# Patient Record
Sex: Male | Born: 1945 | Race: White | Hispanic: No | Smoking: Never smoker
Health system: Southern US, Community
[De-identification: ages and names within clinical notes are randomized; demographics above are authoritative.]

## PROBLEM LIST (undated history)

## (undated) DIAGNOSIS — E119 Type 2 diabetes mellitus without complications: Secondary | ICD-10-CM

## (undated) DIAGNOSIS — F329 Major depressive disorder, single episode, unspecified: Secondary | ICD-10-CM

## (undated) DIAGNOSIS — N289 Disorder of kidney and ureter, unspecified: Secondary | ICD-10-CM

## (undated) DIAGNOSIS — N132 Hydronephrosis with renal and ureteral calculous obstruction: Secondary | ICD-10-CM

## (undated) DIAGNOSIS — I10 Essential (primary) hypertension: Secondary | ICD-10-CM

## (undated) DIAGNOSIS — S22009A Unspecified fracture of unspecified thoracic vertebra, initial encounter for closed fracture: Secondary | ICD-10-CM

## (undated) DIAGNOSIS — T148XXA Other injury of unspecified body region, initial encounter: Secondary | ICD-10-CM

## (undated) DIAGNOSIS — S2239XA Fracture of one rib, unspecified side, initial encounter for closed fracture: Secondary | ICD-10-CM

## (undated) DIAGNOSIS — E785 Hyperlipidemia, unspecified: Secondary | ICD-10-CM

## (undated) DIAGNOSIS — F32A Depression, unspecified: Secondary | ICD-10-CM

## (undated) DIAGNOSIS — R55 Syncope and collapse: Secondary | ICD-10-CM

## (undated) DIAGNOSIS — E7800 Pure hypercholesterolemia, unspecified: Secondary | ICD-10-CM

## (undated) DIAGNOSIS — M1990 Unspecified osteoarthritis, unspecified site: Secondary | ICD-10-CM

## (undated) HISTORY — DX: Fracture of one rib, unspecified side, initial encounter for closed fracture: S22.39XA

## (undated) HISTORY — DX: Essential (primary) hypertension: I10

## (undated) HISTORY — DX: Other injury of unspecified body region, initial encounter: T14.8XXA

## (undated) HISTORY — DX: Hyperlipidemia, unspecified: E78.5

## (undated) HISTORY — DX: Depression, unspecified: F32.A

## (undated) HISTORY — DX: Hydronephrosis with renal and ureteral calculous obstruction: N13.2

## (undated) HISTORY — DX: Type 2 diabetes mellitus without complications: E11.9

## (undated) HISTORY — DX: Major depressive disorder, single episode, unspecified: F32.9

## (undated) HISTORY — PX: SKIN GRAFT: SHX250

## (undated) HISTORY — DX: Unspecified fracture of unspecified thoracic vertebra, initial encounter for closed fracture: S22.009A

## (undated) HISTORY — DX: Syncope and collapse: R55

## (undated) HISTORY — DX: Pure hypercholesterolemia, unspecified: E78.00

## (undated) HISTORY — PX: CLAVICLE SURGERY: SHX598

## (undated) HISTORY — DX: Unspecified osteoarthritis, unspecified site: M19.90

## (undated) HISTORY — PX: SHOULDER SURGERY: SHX246

---

## 1999-02-19 ENCOUNTER — Ambulatory Visit (HOSPITAL_COMMUNITY): Admission: RE | Admit: 1999-02-19 | Discharge: 1999-02-19 | Payer: Self-pay | Admitting: Gastroenterology

## 2000-01-03 ENCOUNTER — Emergency Department (HOSPITAL_COMMUNITY): Admission: EM | Admit: 2000-01-03 | Discharge: 2000-01-03 | Payer: Self-pay | Admitting: Emergency Medicine

## 2000-01-04 ENCOUNTER — Encounter: Payer: Self-pay | Admitting: Emergency Medicine

## 2002-02-15 ENCOUNTER — Other Ambulatory Visit: Admission: RE | Admit: 2002-02-15 | Discharge: 2002-02-15 | Payer: Self-pay | Admitting: *Deleted

## 2005-05-31 ENCOUNTER — Emergency Department (HOSPITAL_COMMUNITY): Admission: EM | Admit: 2005-05-31 | Discharge: 2005-05-31 | Payer: Self-pay | Admitting: Emergency Medicine

## 2011-11-09 ENCOUNTER — Other Ambulatory Visit: Payer: Self-pay | Admitting: Gastroenterology

## 2012-12-20 ENCOUNTER — Other Ambulatory Visit: Payer: Self-pay | Admitting: Family Medicine

## 2012-12-20 DIAGNOSIS — G8929 Other chronic pain: Secondary | ICD-10-CM

## 2012-12-27 ENCOUNTER — Ambulatory Visit
Admission: RE | Admit: 2012-12-27 | Discharge: 2012-12-27 | Disposition: A | Discharge status: Home / Self Care | Payer: Medicare HMO | Source: Ambulatory Visit | Attending: Family Medicine | Admitting: Family Medicine

## 2012-12-27 DIAGNOSIS — G8929 Other chronic pain: Secondary | ICD-10-CM

## 2012-12-27 MED ORDER — IOHEXOL 300 MG/ML  SOLN
100.0000 mL | Freq: Once | INTRAMUSCULAR | Status: AC | PRN
  Administered 2012-12-27: 100 mL via INTRAVENOUS

## 2013-03-26 ENCOUNTER — Other Ambulatory Visit: Payer: Self-pay | Admitting: Family Medicine

## 2013-03-26 ENCOUNTER — Ambulatory Visit
Admission: RE | Admit: 2013-03-26 | Discharge: 2013-03-26 | Disposition: A | Discharge status: Home / Self Care | Payer: Medicare HMO | Source: Ambulatory Visit | Attending: Family Medicine | Admitting: Family Medicine

## 2013-03-26 DIAGNOSIS — M542 Cervicalgia: Secondary | ICD-10-CM

## 2013-03-26 DIAGNOSIS — M545 Low back pain: Secondary | ICD-10-CM

## 2013-10-10 ENCOUNTER — Ambulatory Visit
Admission: RE | Admit: 2013-10-10 | Discharge: 2013-10-10 | Disposition: A | Discharge status: Home / Self Care | Payer: Medicare HMO | Source: Ambulatory Visit | Attending: Family Medicine | Admitting: Family Medicine

## 2013-10-10 ENCOUNTER — Other Ambulatory Visit: Payer: Self-pay | Admitting: Family Medicine

## 2013-10-10 DIAGNOSIS — M2550 Pain in unspecified joint: Secondary | ICD-10-CM

## 2013-10-11 ENCOUNTER — Ambulatory Visit (INDEPENDENT_AMBULATORY_CARE_PROVIDER_SITE_OTHER): Payer: Medicare HMO | Admitting: General Surgery

## 2013-10-19 ENCOUNTER — Ambulatory Visit (INDEPENDENT_AMBULATORY_CARE_PROVIDER_SITE_OTHER): Payer: Medicare HMO | Admitting: General Surgery

## 2013-10-19 ENCOUNTER — Encounter (INDEPENDENT_AMBULATORY_CARE_PROVIDER_SITE_OTHER): Payer: Self-pay | Admitting: General Surgery

## 2013-10-19 VITALS — Temp 98.0°F | Ht 68.0 in | Wt 176.8 lb

## 2013-10-19 DIAGNOSIS — K4090 Unilateral inguinal hernia, without obstruction or gangrene, not specified as recurrent: Secondary | ICD-10-CM
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
## 2013-10-19 NOTE — Patient Instructions (Signed)
You have a large left inguinal hernia. We are able to push this back in so there is no immediate danger  You'll be scheduled for elective repair of your left inguinal hernia with mesh in the near future     Inguinal Hernia, Adult  Care After Refer to this sheet in the next few weeks. These discharge instructions provide you with general information on caring for yourself after you leave the hospital. Your caregiver may also give you specific instructions. Your treatment has been planned according to the most current medical practices available, but unavoidable complications sometimes occur. If you have any problems or questions after discharge, please call your caregiver. HOME CARE INSTRUCTIONS  Put ice on the operative site.  Put ice in a plastic bag.  Place a towel between your skin and the bag.  Leave the ice on for 15-20 minutes at a time, 03-04 times a day while awake.  Change bandages (dressings) as directed.  Keep the wound dry and clean. The wound may be washed gently with soap and water. Gently blot or dab the wound dry. It is okay to take showers 24 to 48 hours after surgery. Do not take baths, use swimming pools, or use hot tubs for 10 days, or as directed by your caregiver.  Only take over-the-counter or prescription medicines for pain, discomfort, or fever as directed by your caregiver.  Continue your normal diet as directed.  Do not lift anything more than 10 pounds or play contact sports for 3 weeks, or as directed. SEEK MEDICAL CARE IF:  There is redness, swelling, or increasing pain in the wound.  There is fluid (pus) coming from the wound.  There is drainage from a wound lasting longer than 1 day.  You have an oral temperature above 102 F (38.9 C).  You notice a bad smell coming from the wound or dressing.  The wound breaks open after the stitches (sutures) have been removed.  You notice increasing pain in the shoulders (shoulder strap areas).  You  develop dizzy episodes or fainting while standing.  You feel sick to your stomach (nauseous) or throw up (vomit). SEEK IMMEDIATE MEDICAL CARE IF:  You develop a rash.  You have difficulty breathing.  You develop a reaction or have side effects to medicines you were given. MAKE SURE YOU:   Understand these instructions.  Will watch your condition.  Will get help right away if you are not doing well or get worse. Document Released: 09/02/2006 Document Revised: 10/25/2011 Document Reviewed: 07/02/2009 [facility] Patient Information 2014 [place], [place]. Inguinal Hernia, Adult Muscles help keep everything in the body in its proper place. But if a weak spot in the muscles develops, something can poke through. That is called a hernia. When this happens in the lower part of the belly (abdomen), it is called an inguinal hernia. (It takes its name from a part of the body in this region called the inguinal canal.) A weak spot in the wall of muscles lets some fat or part of the small intestine bulge through. An inguinal hernia can develop at any age. Men get them more often than women. CAUSES  In adults, an inguinal hernia develops over time.  It can be triggered by:  Suddenly straining the muscles of the lower abdomen.  Lifting heavy objects.  Straining to have a bowel movement. Difficult bowel movements (constipation) can lead to this.  Constant coughing. This may be caused by smoking or lung disease.  Being overweight.  Being  pregnant.  Working at a job that requires long periods of standing or heavy lifting.  Having had an inguinal hernia before. One type can be an emergency situation. It is called a strangulated inguinal hernia. It develops if part of the small intestine slips through the weak spot and cannot get back into the abdomen. The blood supply can be cut off. If that happens, part of the intestine may die. This situation requires emergency surgery. SYMPTOMS  Often, a small  inguinal hernia has no symptoms. It is found when a healthcare provider does a physical exam. Larger hernias usually have symptoms.   In adults, symptoms may include:  A lump in the groin. This is easier to see when the person is standing. It might disappear when lying down.  In men, a lump in the scrotum.  Pain or burning in the groin. This occurs especially when lifting, straining or coughing.  A dull ache or feeling of pressure in the groin.  Signs of a strangulated hernia can include:  A bulge in the groin that becomes very painful and tender to the touch.  A bulge that turns red or purple.  Fever, nausea and vomiting.  Inability to have a bowel movement or to pass gas. DIAGNOSIS  To decide if you have an inguinal hernia, a healthcare provider will probably do a physical examination.  This will include asking questions about any symptoms you have noticed.  The healthcare provider might feel the groin area and ask you to cough. If an inguinal hernia is felt, the healthcare provider may try to slide it back into the abdomen.  Usually no other tests are needed. TREATMENT  Treatments can vary. The size of the hernia makes a difference. Options include:  Watchful waiting. This is often suggested if the hernia is small and you have had no symptoms.  No medical procedure will be done unless symptoms develop.  You will need to watch closely for symptoms. If any occur, contact your healthcare provider right away.  Surgery. This is used if the hernia is larger or you have symptoms.  Open surgery. This is usually an outpatient procedure (you will not stay overnight in a hospital). An cut (incision) is made through the skin in the groin. The hernia is put back inside the abdomen. The weak area in the muscles is then repaired by herniorrhaphy or hernioplasty. Herniorrhaphy: in this type of surgery, the weak muscles are sewn back together. Hernioplasty: a patch or mesh is used to close  the weak area in the abdominal wall.  Laparoscopy. In this procedure, a surgeon makes small incisions. A thin tube with a tiny video camera (called a laparoscope) is put into the abdomen. The surgeon repairs the hernia with mesh by looking with the video camera and using two long instruments. HOME CARE INSTRUCTIONS   After surgery to repair an inguinal hernia:  You will need to take pain medicine prescribed by your healthcare provider. Follow all directions carefully.  You will need to take care of the wound from the incision.  Your activity will be restricted for awhile. This will probably include no heavy lifting for several weeks. You also should not do anything too active for a few weeks. When you can return to work will depend on the type of job that you have.  During "watchful waiting" periods, you should:  Maintain a healthy weight.  Eat a diet high in fiber (fruits, vegetables and whole grains).  Drink plenty of fluids to  avoid constipation. This means drinking enough water and other liquids to keep your urine clear or pale yellow.  Do not lift heavy objects.  Do not stand for long periods of time.  Quit smoking. This should keep you from developing a frequent cough. SEEK MEDICAL CARE IF:   A bulge develops in your groin area.  You feel pain, a burning sensation or pressure in the groin. This might be worse if you are lifting or straining.  You develop a fever of more than 100.5 F (38.1 C). SEEK IMMEDIATE MEDICAL CARE IF:   Pain in the groin increases suddenly.  A bulge in the groin gets bigger suddenly and does not go down.  For men, there is sudden pain in the scrotum. Or, the size of the scrotum increases.  A bulge in the groin area becomes red or purple and is painful to touch.  You have nausea or vomiting that does not go away.  You feel your heart beating much faster than normal.  You cannot have a bowel movement or pass gas.  You develop a fever of  more than 102.0 F (38.9 C). Document Released: 12/19/2008 Document Revised: 10/25/2011 Document Reviewed: 12/19/2008 [facility] Patient Information 2014 [place], [place].

## 2013-12-07 ENCOUNTER — Other Ambulatory Visit (INDEPENDENT_AMBULATORY_CARE_PROVIDER_SITE_OTHER): Payer: Self-pay

## 2013-12-07 DIAGNOSIS — K4090 Unilateral inguinal hernia, without obstruction or gangrene, not specified as recurrent: Secondary | ICD-10-CM

## 2013-12-07 HISTORY — PX: OTHER SURGICAL HISTORY: SHX169

## 2013-12-07 MED ORDER — OXYCODONE-ACETAMINOPHEN 7.5-325 MG PO TABS: 1.0000 | ORAL_TABLET | ORAL | Status: DC | PRN

## 2013-12-10 ENCOUNTER — Telehealth (INDEPENDENT_AMBULATORY_CARE_PROVIDER_SITE_OTHER): Payer: Self-pay | Admitting: General Surgery

## 2013-12-10 NOTE — Telephone Encounter (Signed)
Pt called and is having some skin irritation below hernia site  And where shaved and prep him for surgery/ no redness or infection seen by pt. Suggested that he try some zinc oxide or vasaline on area . If does not get better to let us know. He will call within next 24-48 hours if not better . He seemed satisfied  With trying that for now. 12/10/13

## 2013-12-25 ENCOUNTER — Ambulatory Visit (INDEPENDENT_AMBULATORY_CARE_PROVIDER_SITE_OTHER): Payer: Medicare HMO | Admitting: General Surgery

## 2013-12-25 ENCOUNTER — Encounter (INDEPENDENT_AMBULATORY_CARE_PROVIDER_SITE_OTHER): Payer: Self-pay | Admitting: General Surgery

## 2013-12-25 VITALS — BP 130/70 | HR 80 | Resp 18 | Ht 68.0 in | Wt 174.4 lb

## 2013-12-25 DIAGNOSIS — K4090 Unilateral inguinal hernia, without obstruction or gangrene, not specified as recurrent: Secondary | ICD-10-CM

## 2013-12-25 NOTE — Progress Notes (Signed)
Patient ID: Gerald Ray, male   DOB: [birth year], 68 y.o.   MRN: [id number] History: This patient underwent open repair of left inguinal hernia with mesh: 12/07/2013. He is doing very well. He is requesting that he be allowed to play golf this weekend. No complaints of that wound. It is a little burning and numbness beneath the incision. Penis and scrotum feels fine  Exam: Right groin incision healing normally. Tissues quite soft. No hematoma. Repair intact. Penis scrotum and testes normal  Assessment: Left inguinal hernia, recovering uneventfully following open repair with mesh  Plan: I told him that he could pitch and putt  this weekend but to wait another 10 days before taking a full swing. Lots of walking encouraged to stretch the groins. Return to see me as needed.   Edsel Petrin. Dalbert Batman, M.D., [facility] General and Minimally invasive Surgery Breast and Colorectal Surgery Office:   [phone] Pager:   [phone]

## 2013-12-25 NOTE — Patient Instructions (Signed)
You are recovering from your left inguinal hernia repair with mesh without any obvious surgical complications.  We have talked about stretching and exercise and return to normal activities.  Return to see Dr. Dalbert Batman if necessary.

## 2014-01-25 ENCOUNTER — Encounter (INDEPENDENT_AMBULATORY_CARE_PROVIDER_SITE_OTHER): Payer: Self-pay | Admitting: Surgery

## 2014-01-25 ENCOUNTER — Ambulatory Visit (INDEPENDENT_AMBULATORY_CARE_PROVIDER_SITE_OTHER): Payer: Medicare HMO | Admitting: Surgery

## 2014-01-25 VITALS — BP 122/76 | HR 69 | Temp 97.8°F | Resp 16 | Ht 68.0 in | Wt 177.0 lb

## 2014-01-25 DIAGNOSIS — L03314 Cellulitis of groin: Secondary | ICD-10-CM

## 2014-01-25 DIAGNOSIS — K4090 Unilateral inguinal hernia, without obstruction or gangrene, not specified as recurrent: Secondary | ICD-10-CM

## 2014-01-25 DIAGNOSIS — L03319 Cellulitis of trunk, unspecified: Secondary | ICD-10-CM

## 2014-01-25 DIAGNOSIS — L02219 Cutaneous abscess of trunk, unspecified: Secondary | ICD-10-CM

## 2014-01-25 MED ORDER — MUPIROCIN 2 % EX OINT
TOPICAL_OINTMENT | CUTANEOUS | Status: DC
Start: 2014-01-25 — End: 2017-12-01

## 2014-01-25 MED ORDER — SULFAMETHOXAZOLE-TMP DS 800-160 MG PO TABS: 1.0000 | ORAL_TABLET | Freq: Two times a day (BID) | ORAL | Status: DC

## 2014-01-25 NOTE — Progress Notes (Signed)
General Surgery [facility]  Chief Complaint  Patient presents with  . Post-op Problem    drainage from hernia incision - patient of Dr. Dalbert Batman    HISTORY: Patient is a 68 year old male who underwent repair of left inguinal hernia with mesh in late April 2015. Patient has developed swelling, redness, and drainage from just above the midportion of his left inguinal incision. He presents today for evaluation.  EXAM: There is erythema involving the entire upper skin flap above the incision in the left groin. There is an ulcerated wound in the central portion measuring 1 cm in greatest diameter. There is a tiny amount of what appears to be serous drainage. There is mild to moderate induration and mild tenderness. Palpation in the inguinal canal shows no sign of recurrent hernia and no abscess.  IMPRESSION: Cellulitis involving left groin incision following hernia repair  PLAN: I am going to start the patient on Bactrim DS for 10 days. I will also give him topical Bactroban ointment to apply to the wound.  Patient will cleanse the wound daily with soap and water.  Patient will return for wound check to see Dr. Fanny Skates in 10-14 days.  Earnstine Regal, MD, [facility]   Visit Diagnoses: 1. Left inguinal hernia   2. Cellulitis of groin, left

## 2014-01-25 NOTE — Patient Instructions (Signed)
Take Bactrim as directed.  Applied Bactroban ointment as directed.  Shower with soap and water once daily.  Cover wound with dry gauze or Band-Aids as long as there is drainage.  Earnstine Regal, MD, [facility] Office: [phone]

## 2014-02-01 ENCOUNTER — Ambulatory Visit (INDEPENDENT_AMBULATORY_CARE_PROVIDER_SITE_OTHER): Payer: Medicare HMO | Admitting: General Surgery

## 2014-02-01 ENCOUNTER — Encounter (INDEPENDENT_AMBULATORY_CARE_PROVIDER_SITE_OTHER): Payer: Self-pay | Admitting: General Surgery

## 2014-02-01 VITALS — BP 132/78 | HR 80 | Temp 97.5°F | Ht 68.0 in | Wt 171.0 lb

## 2014-02-01 DIAGNOSIS — L02219 Cutaneous abscess of trunk, unspecified: Secondary | ICD-10-CM

## 2014-02-01 DIAGNOSIS — L03314 Cellulitis of groin: Secondary | ICD-10-CM

## 2014-02-01 DIAGNOSIS — K4090 Unilateral inguinal hernia, without obstruction or gangrene, not specified as recurrent: Secondary | ICD-10-CM

## 2014-02-01 DIAGNOSIS — L03319 Cellulitis of trunk, unspecified: Secondary | ICD-10-CM

## 2014-02-01 NOTE — Progress Notes (Signed)
Patient ID: Gerald Ray, male   DOB: March 31, 1946, 68 y.o.   MRN: [id number] History: This patient underwent elective repair of left inguinal hernia with mesh on 12/07/2013. He is diabetic. Seen in the office on May 12 and everything looked good. Some occurred on June 12 at which time if he appeared to have a superficial wound infection and was placed on Bactrim DS. He states he feels much better. It is almost completely healed. He says he stopped wearing a bandage yesterday  Exam:  The patient looks well. Spears. No distress Left inguinal incision looks good. The tissues were quite soft and there is no evidence of any deep infection. There is still a tiny bit of faint erythema at the edge of the incision but no drainage or odor or abscess. The skin necrosis  Assessment:  superficial wound infection, resolving on antibiotics No evidence of deep infection or mesh infection Diabetes-type 2  Plan: Wound care and wound hygiene discussed Continue antibiotics for 3 more days Return to see me if there is any evidence of infection.   Edsel Petrin. Dalbert Batman, M.D., [facility] General and Minimally invasive Surgery Breast and Colorectal Surgery Office:   [phone] Pager:   [phone]

## 2014-02-01 NOTE — Patient Instructions (Addendum)
The infection in your left inguinal incision appears to be healing nicely. This appears to be very superficial, and does not appear to be involving the deep layers or the mesh.  Take a shower twice a day and then cover the wound with a clean dry Band-Aid.  Continue the antibiotics for 3 more days, as instructed  Return to see Dr. Dalbert Batman if there is any recurrence of redness pain or drainage.

## 2015-10-06 DIAGNOSIS — Z79899 Other long term (current) drug therapy: Secondary | ICD-10-CM | POA: Diagnosis not present

## 2015-10-06 DIAGNOSIS — E7800 Pure hypercholesterolemia, unspecified: Secondary | ICD-10-CM | POA: Diagnosis not present

## 2015-10-06 DIAGNOSIS — E119 Type 2 diabetes mellitus without complications: Secondary | ICD-10-CM | POA: Diagnosis not present

## 2015-10-06 DIAGNOSIS — F411 Generalized anxiety disorder: Secondary | ICD-10-CM | POA: Diagnosis not present

## 2015-10-06 DIAGNOSIS — Z0000 Encounter for general adult medical examination without abnormal findings: Secondary | ICD-10-CM | POA: Diagnosis not present

## 2015-10-06 DIAGNOSIS — Z1159 Encounter for screening for other viral diseases: Secondary | ICD-10-CM | POA: Diagnosis not present

## 2015-10-06 DIAGNOSIS — I10 Essential (primary) hypertension: Secondary | ICD-10-CM | POA: Diagnosis not present

## 2015-10-06 DIAGNOSIS — F419 Anxiety disorder, unspecified: Secondary | ICD-10-CM | POA: Diagnosis not present

## 2015-10-06 DIAGNOSIS — Z7984 Long term (current) use of oral hypoglycemic drugs: Secondary | ICD-10-CM | POA: Diagnosis not present

## 2016-01-09 DIAGNOSIS — E119 Type 2 diabetes mellitus without complications: Secondary | ICD-10-CM | POA: Diagnosis not present

## 2016-01-09 DIAGNOSIS — H25013 Cortical age-related cataract, bilateral: Secondary | ICD-10-CM | POA: Diagnosis not present

## 2016-01-09 DIAGNOSIS — H2513 Age-related nuclear cataract, bilateral: Secondary | ICD-10-CM | POA: Diagnosis not present

## 2016-01-09 DIAGNOSIS — Z0100 Encounter for examination of eyes and vision without abnormal findings: Secondary | ICD-10-CM | POA: Diagnosis not present

## 2016-04-20 DIAGNOSIS — Z23 Encounter for immunization: Secondary | ICD-10-CM | POA: Diagnosis not present

## 2016-04-20 DIAGNOSIS — I10 Essential (primary) hypertension: Secondary | ICD-10-CM | POA: Diagnosis not present

## 2016-04-20 DIAGNOSIS — E7800 Pure hypercholesterolemia, unspecified: Secondary | ICD-10-CM | POA: Diagnosis not present

## 2016-04-20 DIAGNOSIS — F419 Anxiety disorder, unspecified: Secondary | ICD-10-CM | POA: Diagnosis not present

## 2016-04-20 DIAGNOSIS — E119 Type 2 diabetes mellitus without complications: Secondary | ICD-10-CM | POA: Diagnosis not present

## 2016-04-20 DIAGNOSIS — Z7984 Long term (current) use of oral hypoglycemic drugs: Secondary | ICD-10-CM | POA: Diagnosis not present

## 2016-04-20 DIAGNOSIS — K219 Gastro-esophageal reflux disease without esophagitis: Secondary | ICD-10-CM | POA: Diagnosis not present

## 2016-04-20 DIAGNOSIS — Z79899 Other long term (current) drug therapy: Secondary | ICD-10-CM | POA: Diagnosis not present

## 2016-11-14 DIAGNOSIS — N132 Hydronephrosis with renal and ureteral calculous obstruction: Secondary | ICD-10-CM

## 2016-11-14 HISTORY — DX: Hydronephrosis with renal and ureteral calculous obstruction: N13.2

## 2016-11-18 ENCOUNTER — Encounter (HOSPITAL_COMMUNITY): Payer: Self-pay | Admitting: Emergency Medicine

## 2016-11-18 ENCOUNTER — Emergency Department (HOSPITAL_COMMUNITY): Payer: PPO

## 2016-11-18 ENCOUNTER — Emergency Department (HOSPITAL_COMMUNITY)
Admission: EM | Admit: 2016-11-18 | Discharge: 2016-11-18 | Disposition: A | Discharge status: Home / Self Care | Payer: PPO | Attending: Emergency Medicine | Admitting: Emergency Medicine

## 2016-11-18 DIAGNOSIS — R109 Unspecified abdominal pain: Secondary | ICD-10-CM | POA: Diagnosis not present

## 2016-11-18 DIAGNOSIS — I10 Essential (primary) hypertension: Secondary | ICD-10-CM | POA: Insufficient documentation

## 2016-11-18 DIAGNOSIS — N201 Calculus of ureter: Secondary | ICD-10-CM | POA: Diagnosis not present

## 2016-11-18 DIAGNOSIS — R1030 Lower abdominal pain, unspecified: Secondary | ICD-10-CM | POA: Diagnosis not present

## 2016-11-18 DIAGNOSIS — Z7984 Long term (current) use of oral hypoglycemic drugs: Secondary | ICD-10-CM | POA: Diagnosis not present

## 2016-11-18 DIAGNOSIS — N1330 Unspecified hydronephrosis: Secondary | ICD-10-CM | POA: Diagnosis not present

## 2016-11-18 DIAGNOSIS — Z7982 Long term (current) use of aspirin: Secondary | ICD-10-CM | POA: Insufficient documentation

## 2016-11-18 DIAGNOSIS — R319 Hematuria, unspecified: Secondary | ICD-10-CM | POA: Diagnosis not present

## 2016-11-18 DIAGNOSIS — E119 Type 2 diabetes mellitus without complications: Secondary | ICD-10-CM | POA: Diagnosis not present

## 2016-11-18 LAB — COMPREHENSIVE METABOLIC PANEL
ALT: 16 U/L — ABNORMAL LOW (ref 17–63)
ANION GAP: 18 — AB (ref 5–15)
AST: 28 U/L (ref 15–41)
Albumin: 4.1 g/dL (ref 3.5–5.0)
Alkaline Phosphatase: 46 U/L (ref 38–126)
BILIRUBIN TOTAL: 0.7 mg/dL (ref 0.3–1.2)
BUN: 26 mg/dL — AB (ref 6–20)
CHLORIDE: 99 mmol/L — AB (ref 101–111)
CO2: 22 mmol/L (ref 22–32)
Calcium: 9.8 mg/dL (ref 8.9–10.3)
Creatinine, Ser: 1.19 mg/dL (ref 0.61–1.24)
GFR calc Af Amer: >60 mL/min (ref >60)
GFR calc non Af Amer: 59 mL/min — ABNORMAL LOW (ref >60)
Glucose, Bld: 193 mg/dL — ABNORMAL HIGH (ref 65–99)
POTASSIUM: 3.9 mmol/L (ref 3.5–5.1)
Sodium: 139 mmol/L (ref 135–145)
TOTAL PROTEIN: 7.6 g/dL (ref 6.5–8.1)

## 2016-11-18 LAB — URINALYSIS, ROUTINE W REFLEX MICROSCOPIC
BACTERIA UA: NONE SEEN
BILIRUBIN URINE: NEGATIVE
Glucose, UA: 50 mg/dL — AB
Ketones, ur: 5 mg/dL — AB
Leukocytes, UA: NEGATIVE
NITRITE: NEGATIVE
PROTEIN: NEGATIVE mg/dL
SQUAMOUS EPITHELIAL / LPF: NONE SEEN
Specific Gravity, Urine: 1.024 (ref 1.005–1.030)
pH: 5 (ref 5.0–8.0)

## 2016-11-18 LAB — CBC
HEMATOCRIT: 38.2 % — AB (ref 39.0–52.0)
HEMOGLOBIN: 13.3 g/dL (ref 13.0–17.0)
MCH: 30.3 pg (ref 26.0–34.0)
MCHC: 34.8 g/dL (ref 30.0–36.0)
MCV: 87 fL (ref 78.0–100.0)
Platelets: 197 10*3/uL (ref 150–400)
RBC: 4.39 MIL/uL (ref 4.22–5.81)
RDW: 12.8 % (ref 11.5–15.5)
WBC: 9.3 10*3/uL (ref 4.0–10.5)

## 2016-11-18 LAB — LIPASE, BLOOD: Lipase: 18 U/L (ref 11–51)

## 2016-11-18 MED ORDER — IBUPROFEN 800 MG PO TABS: 800.0000 mg | ORAL_TABLET | Freq: Three times a day (TID) | ORAL | 0 refills | Status: AC

## 2016-11-18 MED ORDER — ONDANSETRON 4 MG PO TBDP: 4.0000 mg | ORAL_TABLET | Freq: Three times a day (TID) | ORAL | 0 refills | Status: DC | PRN

## 2016-11-18 MED ORDER — ONDANSETRON 4 MG PO TBDP
ORAL_TABLET | ORAL | Status: AC
  Filled 2016-11-18: qty 1

## 2016-11-18 MED ORDER — ONDANSETRON 4 MG PO TBDP
4.0000 mg | ORAL_TABLET | Freq: Once | ORAL | Status: AC | PRN
  Administered 2016-11-18: 4 mg via ORAL

## 2016-11-18 MED ORDER — TAMSULOSIN HCL 0.4 MG PO CAPS: 0.4000 mg | ORAL_CAPSULE | Freq: Every day | ORAL | 0 refills | Status: AC

## 2016-11-18 NOTE — ED Notes (Signed)
Patient transported to CT 

## 2016-11-18 NOTE — Discharge Instructions (Signed)
Take the prescribed medication as directed. Follow-up with urology if you have any ongoing issues. Return to the ED for new or worsening symptoms.

## 2016-11-18 NOTE — ED Notes (Signed)
Pt. Given urine strainer at this time.

## 2016-11-18 NOTE — ED Triage Notes (Signed)
Pt woke up at 4am with sudden onset of lower right abd pain that moved into his right flank. history of kidney stones. Pt has been "dry heaving" since pain started.

## 2016-11-18 NOTE — ED Provider Notes (Signed)
[facility] DEPT Provider Note   CSN: [id number] Arrival date & time: 11/18/16  0703     History   Chief Complaint Chief Complaint  Patient presents with  . Abdominal Pain    HPI Gerald Ray is a 71 y.o. male.  The history is provided by the patient and medical records.    71 year old male with history of arthritis, diabetes, hyperlipidemia, hypertension, presenting to the ED for right flank pain. States this began this morning around 4:30 AM. Was localized to his suprapubic region but has since had radiation to the right flank. He reports some associated nausea and dry heaves. He has not had any fever or chills. No difficulty urinating or hematuria. Does report history of kidney stones twice in the past, over 20 years ago. States these were able to be passed spontaneously. He has had a left inguinal hernia repair in the past, no other surgeries. He was given Zofran in the waiting room with resolution of his nausea. States he is comfortable at present.  Past Medical History:  Diagnosis Date  . Arthritis   . Diabetes mellitus without complication ([place])   . Hyperlipidemia   . Hypertension     Patient Active Problem List   Diagnosis Date Noted  . Cellulitis of groin, left 01/25/2014  . Left inguinal hernia 10/19/2013    Past Surgical History:  Procedure Laterality Date  . CLAVICLE SURGERY     left  . LIH w/mesh  12/07/13  . SHOULDER SURGERY     growth on right  . SKIN GRAFT     right leg       Home Medications    Prior to Admission medications   Medication Sig Start Date End Date Taking? Authorizing Provider  aspirin 81 MG tablet Take 81 mg by mouth daily.    Historical Provider, MD  CINNAMON PO Take 1,000 mg by mouth.    Historical Provider, MD  co-enzyme Q-10 50 MG capsule Take 50 mg by mouth daily.    Historical Provider, MD  glimepiride (AMARYL) 1 MG tablet Take 1 mg by mouth daily with breakfast.    Historical Provider, MD  lisinopril  (PRINIVIL,ZESTRIL) 20 MG tablet Take 20 mg by mouth daily.    Historical Provider, MD  metFORMIN (GLUCOPHAGE) 1000 MG tablet Take 1,000 mg by mouth 2 (two) times daily with a meal.    Historical Provider, MD  Multiple Vitamins-Minerals (SENTRY SENIOR PO) Take by mouth.    Historical Provider, MD  mupirocin ointment (BACTROBAN) 2 % Apply topically to area around incision three times daily. 01/25/14   Armandina Gemma, MD  sulfamethoxazole-trimethoprim (BACTRIM DS) 800-160 MG per tablet Take 1 tablet by mouth 2 (two) times daily. 01/25/14   Armandina Gemma, MD    Family History Family History  Problem Relation Age of Onset  . Diabetes Father     Social History Social History  Substance Use Topics  . Smoking status: Never Smoker  . Smokeless tobacco: Never Used  . Alcohol use No     Allergies   Erythromycin and Vytorin [ezetimibe-simvastatin]   Review of Systems Review of Systems  Gastrointestinal: Positive for nausea.  Genitourinary: Positive for flank pain.  All other systems reviewed and are negative.    Physical Exam Updated Vital Signs BP (!) 149/71 (BP Location: Left Arm)   Pulse 75   Temp 97.8 F (36.6 C) (Oral)   Resp 17   Ht 5\' 10"  (1.778 m)   Wt 80.7 kg  SpO2 100%   BMI 25.54 kg/m   Physical Exam  Constitutional: He is oriented to person, place, and time. He appears well-developed and well-nourished.  HENT:  Head: Normocephalic and atraumatic.  Mouth/Throat: Oropharynx is clear and moist.  Eyes: Conjunctivae and EOM are normal. Pupils are equal, round, and reactive to light.  Neck: Normal range of motion.  Cardiovascular: Normal rate, regular rhythm and normal heart sounds.   Pulmonary/Chest: Effort normal and breath sounds normal. No respiratory distress. He has no wheezes.  Abdominal: Soft. Bowel sounds are normal. There is no tenderness. There is CVA tenderness (right). There is no rebound.  Musculoskeletal: Normal range of motion.  Neurological: He is alert  and oriented to person, place, and time.  Skin: Skin is warm and dry.  Psychiatric: He has a normal mood and affect.  Nursing note and vitals reviewed.    ED Treatments / Results  Labs (all labs ordered are listed, but only abnormal results are displayed) Labs Reviewed  COMPREHENSIVE METABOLIC PANEL - Abnormal; Notable for the following:       Result Value   Chloride 99 (*)    Glucose, Bld 193 (*)    BUN 26 (*)    ALT 16 (*)    GFR calc non Af Amer 59 (*)    Anion gap 18 (*)    All other components within normal limits  CBC - Abnormal; Notable for the following:    HCT 38.2 (*)    All other components within normal limits  URINALYSIS, ROUTINE W REFLEX MICROSCOPIC - Abnormal; Notable for the following:    APPearance HAZY (*)    Glucose, UA 50 (*)    Hgb urine dipstick LARGE (*)    Ketones, ur 5 (*)    All other components within normal limits  LIPASE, BLOOD    EKG  EKG Interpretation None       Radiology Ct Renal Stone Study  Result Date: 11/18/2016 CLINICAL DATA:  Right flank pain with hematuria EXAM: CT ABDOMEN AND PELVIS WITHOUT CONTRAST TECHNIQUE: Multidetector CT imaging of the abdomen and pelvis was performed following the standard protocol without IV contrast. COMPARISON:  12/27/2012 FINDINGS: Lower chest: No acute abnormality. Hepatobiliary: No focal liver abnormality is seen. No gallstones, gallbladder wall thickening, or biliary dilatation. Pancreas: Unremarkable. No pancreatic ductal dilatation or surrounding inflammatory changes. Spleen: Normal in size without focal abnormality. Adrenals/Urinary Tract: Normal adrenal glands. Punctate bilateral nonobstructing renal calculi. 2 mm distal right ureteral calculus resulting in mild right hydroureteronephrosis. Bilateral perinephric stranding. No renal mass. Decompressed bladder. Stomach/Bowel: No bowel dilatation or bowel wall thickening. Diverticulosis without evidence diverticulitis. No pneumatosis, pneumoperitoneum or  portal venous gas. No abdominal or pelvic free fluid. Vascular/Lymphatic: Normal caliber abdominal aorta with mild atherosclerosis. No lymphadenopathy. Reproductive: Prostate is unremarkable. Other: No abdominal wall hernia or abnormality. No abdominopelvic ascites. Musculoskeletal: No acute osseous abnormality. No lytic or sclerotic osseous lesion. Bilateral osteoarthritis of the sacroiliac joints. IMPRESSION: 1. 2 mm distal right ureteral calculus resulting in mild right hydroureteronephrosis. 2.  Aortic Atherosclerosis (ICD10-170.0) Electronically Signed   By: Kathreen Devoid   On: 11/18/2016 09:40    Procedures Procedures (including critical care time)  Medications Ordered in ED Medications  ondansetron (ZOFRAN-ODT) 4 MG disintegrating tablet (not administered)  ondansetron (ZOFRAN-ODT) disintegrating tablet 4 mg (4 mg Oral Given 11/18/16 0729)     Initial Impression / Assessment and Plan / ED Course  I have reviewed the triage vital signs and the nursing notes.  Pertinent labs & imaging results that were available during my care of the patient were reviewed by me and considered in my medical decision making (see chart for details).  71 year old male here with right flank pain. Began suddenly this morning around 4:30 AM.  He is afebrile and nontoxic. Report some nausea but was given Zofran in triage with improvement. He does have some mild right CVA tenderness. Labwork is overall reassuring. UA with large blood but no signs of infection. CT renal study obtained, there is evidence of 2 mm distal right ureteral calculus with mild hydroureteronephrosis. Patient remains comfortable here, has not required any pain medication. Other stable for discharge. He requested nonnarcotic pain medication, therefore Motrin, Zofran, Flomax given. He was given urology follow-up for any ongoing issues.  Also given urine strainer to monitor for passage of stone.  Discussed plan with patient, he acknowledged  understanding and agreed with plan of care.  Return precautions given for new or worsening symptoms.  Final Clinical Impressions(s) / ED Diagnoses   Final diagnoses:  Ureteral stone  Hematuria, unspecified type    New Prescriptions New Prescriptions   IBUPROFEN (ADVIL,MOTRIN) 800 MG TABLET    Take 1 tablet (800 mg total) by mouth 3 (three) times daily.   ONDANSETRON (ZOFRAN ODT) 4 MG DISINTEGRATING TABLET    Take 1 tablet (4 mg total) by mouth every 8 (eight) hours as needed for nausea.   TAMSULOSIN (FLOMAX) 0.4 MG CAPS CAPSULE    Take 1 capsule (0.4 mg total) by mouth daily after supper.     Larene Pickett, PA-C 11/18/16 [place], MD 11/19/16 1510

## 2017-03-09 DIAGNOSIS — I10 Essential (primary) hypertension: Secondary | ICD-10-CM | POA: Diagnosis not present

## 2017-03-09 DIAGNOSIS — H6121 Impacted cerumen, right ear: Secondary | ICD-10-CM | POA: Diagnosis not present

## 2017-03-09 DIAGNOSIS — E7800 Pure hypercholesterolemia, unspecified: Secondary | ICD-10-CM | POA: Diagnosis not present

## 2017-03-09 DIAGNOSIS — E119 Type 2 diabetes mellitus without complications: Secondary | ICD-10-CM | POA: Diagnosis not present

## 2017-03-09 DIAGNOSIS — N200 Calculus of kidney: Secondary | ICD-10-CM | POA: Diagnosis not present

## 2017-03-25 DIAGNOSIS — H6123 Impacted cerumen, bilateral: Secondary | ICD-10-CM | POA: Diagnosis not present

## 2017-03-25 DIAGNOSIS — H900 Conductive hearing loss, bilateral: Secondary | ICD-10-CM | POA: Diagnosis not present

## 2017-08-10 ENCOUNTER — Inpatient Hospital Stay (HOSPITAL_COMMUNITY)
Admission: EM | Admit: 2017-08-10 | Discharge: 2017-08-12 | DRG: 200 | Disposition: A | Discharge status: Home / Self Care | Payer: PPO | Attending: Surgery | Admitting: Surgery

## 2017-08-10 ENCOUNTER — Other Ambulatory Visit: Payer: Self-pay

## 2017-08-10 ENCOUNTER — Encounter (HOSPITAL_COMMUNITY): Payer: Self-pay | Admitting: *Deleted

## 2017-08-10 ENCOUNTER — Emergency Department (HOSPITAL_COMMUNITY): Payer: PPO

## 2017-08-10 DIAGNOSIS — J939 Pneumothorax, unspecified: Secondary | ICD-10-CM

## 2017-08-10 DIAGNOSIS — I10 Essential (primary) hypertension: Secondary | ICD-10-CM | POA: Diagnosis not present

## 2017-08-10 DIAGNOSIS — E119 Type 2 diabetes mellitus without complications: Secondary | ICD-10-CM | POA: Diagnosis present

## 2017-08-10 DIAGNOSIS — Z7984 Long term (current) use of oral hypoglycemic drugs: Secondary | ICD-10-CM

## 2017-08-10 DIAGNOSIS — W1789XA Other fall from one level to another, initial encounter: Secondary | ICD-10-CM | POA: Diagnosis present

## 2017-08-10 DIAGNOSIS — T148XXA Other injury of unspecified body region, initial encounter: Secondary | ICD-10-CM | POA: Diagnosis not present

## 2017-08-10 DIAGNOSIS — Y998 Other external cause status: Secondary | ICD-10-CM | POA: Diagnosis not present

## 2017-08-10 DIAGNOSIS — S272XXA Traumatic hemopneumothorax, initial encounter: Principal | ICD-10-CM | POA: Diagnosis present

## 2017-08-10 DIAGNOSIS — S22060A Wedge compression fracture of T7-T8 vertebra, initial encounter for closed fracture: Secondary | ICD-10-CM | POA: Diagnosis not present

## 2017-08-10 DIAGNOSIS — S0990XA Unspecified injury of head, initial encounter: Secondary | ICD-10-CM | POA: Diagnosis not present

## 2017-08-10 DIAGNOSIS — Y92821 Forest as the place of occurrence of the external cause: Secondary | ICD-10-CM

## 2017-08-10 DIAGNOSIS — S22069A Unspecified fracture of T7-T8 vertebra, initial encounter for closed fracture: Secondary | ICD-10-CM | POA: Diagnosis not present

## 2017-08-10 DIAGNOSIS — S270XXA Traumatic pneumothorax, initial encounter: Secondary | ICD-10-CM | POA: Diagnosis not present

## 2017-08-10 DIAGNOSIS — R55 Syncope and collapse: Secondary | ICD-10-CM | POA: Diagnosis present

## 2017-08-10 DIAGNOSIS — S22079A Unspecified fracture of T9-T10 vertebra, initial encounter for closed fracture: Secondary | ICD-10-CM | POA: Diagnosis present

## 2017-08-10 DIAGNOSIS — R402362 Coma scale, best motor response, obeys commands, at arrival to emergency department: Secondary | ICD-10-CM | POA: Diagnosis present

## 2017-08-10 DIAGNOSIS — S22070A Wedge compression fracture of T9-T10 vertebra, initial encounter for closed fracture: Secondary | ICD-10-CM | POA: Diagnosis not present

## 2017-08-10 DIAGNOSIS — R402142 Coma scale, eyes open, spontaneous, at arrival to emergency department: Secondary | ICD-10-CM | POA: Diagnosis not present

## 2017-08-10 DIAGNOSIS — R402252 Coma scale, best verbal response, oriented, at arrival to emergency department: Secondary | ICD-10-CM | POA: Diagnosis present

## 2017-08-10 DIAGNOSIS — R109 Unspecified abdominal pain: Secondary | ICD-10-CM | POA: Diagnosis not present

## 2017-08-10 DIAGNOSIS — S2239XA Fracture of one rib, unspecified side, initial encounter for closed fracture: Secondary | ICD-10-CM | POA: Diagnosis present

## 2017-08-10 DIAGNOSIS — S2241XA Multiple fractures of ribs, right side, initial encounter for closed fracture: Secondary | ICD-10-CM | POA: Diagnosis present

## 2017-08-10 DIAGNOSIS — S199XXA Unspecified injury of neck, initial encounter: Secondary | ICD-10-CM | POA: Diagnosis not present

## 2017-08-10 DIAGNOSIS — S299XXA Unspecified injury of thorax, initial encounter: Secondary | ICD-10-CM | POA: Diagnosis not present

## 2017-08-10 DIAGNOSIS — S2249XA Multiple fractures of ribs, unspecified side, initial encounter for closed fracture: Secondary | ICD-10-CM | POA: Diagnosis present

## 2017-08-10 DIAGNOSIS — Y9389 Activity, other specified: Secondary | ICD-10-CM | POA: Diagnosis not present

## 2017-08-10 DIAGNOSIS — S2231XA Fracture of one rib, right side, initial encounter for closed fracture: Secondary | ICD-10-CM | POA: Diagnosis not present

## 2017-08-10 DIAGNOSIS — J9811 Atelectasis: Secondary | ICD-10-CM | POA: Diagnosis not present

## 2017-08-10 DIAGNOSIS — M546 Pain in thoracic spine: Secondary | ICD-10-CM | POA: Diagnosis not present

## 2017-08-10 HISTORY — DX: Disorder of kidney and ureter, unspecified: N28.9

## 2017-08-10 LAB — URINALYSIS, ROUTINE W REFLEX MICROSCOPIC
BILIRUBIN URINE: NEGATIVE
GLUCOSE, UA: 50 mg/dL — AB
Ketones, ur: 5 mg/dL — AB
LEUKOCYTES UA: NEGATIVE
NITRITE: NEGATIVE
Protein, ur: 30 mg/dL — AB
SPECIFIC GRAVITY, URINE: 1.035 — AB (ref 1.005–1.030)
pH: 5 (ref 5.0–8.0)

## 2017-08-10 LAB — COMPREHENSIVE METABOLIC PANEL
ALBUMIN: 3.9 g/dL (ref 3.5–5.0)
ALK PHOS: 44 U/L (ref 38–126)
ALT: 35 U/L (ref 17–63)
ANION GAP: 9 (ref 5–15)
AST: 57 U/L — AB (ref 15–41)
BILIRUBIN TOTAL: 0.8 mg/dL (ref 0.3–1.2)
BUN: 25 mg/dL — ABNORMAL HIGH (ref 6–20)
CHLORIDE: 105 mmol/L (ref 101–111)
CO2: 23 mmol/L (ref 22–32)
Calcium: 9.1 mg/dL (ref 8.9–10.3)
Creatinine, Ser: 1.6 mg/dL — ABNORMAL HIGH (ref 0.61–1.24)
GFR calc non Af Amer: 41 mL/min — ABNORMAL LOW (ref >60)
GFR, EST AFRICAN AMERICAN: 48 mL/min — AB (ref >60)
Glucose, Bld: 231 mg/dL — ABNORMAL HIGH (ref 65–99)
POTASSIUM: 4.8 mmol/L (ref 3.5–5.1)
SODIUM: 137 mmol/L (ref 135–145)
TOTAL PROTEIN: 7.2 g/dL (ref 6.5–8.1)

## 2017-08-10 LAB — CBC
HEMATOCRIT: 39.4 % (ref 39.0–52.0)
Hemoglobin: 13 g/dL (ref 13.0–17.0)
MCH: 28.3 pg (ref 26.0–34.0)
MCHC: 33 g/dL (ref 30.0–36.0)
MCV: 85.7 fL (ref 78.0–100.0)
PLATELETS: 178 10*3/uL (ref 150–400)
RBC: 4.6 MIL/uL (ref 4.22–5.81)
RDW: 13 % (ref 11.5–15.5)
WBC: 14.7 10*3/uL — ABNORMAL HIGH (ref 4.0–10.5)

## 2017-08-10 LAB — I-STAT CHEM 8, ED
BUN: 28 mg/dL — AB (ref 6–20)
CHLORIDE: 102 mmol/L (ref 101–111)
Calcium, Ion: 1.15 mmol/L (ref 1.15–1.40)
Creatinine, Ser: 1.5 mg/dL — ABNORMAL HIGH (ref 0.61–1.24)
Glucose, Bld: 226 mg/dL — ABNORMAL HIGH (ref 65–99)
HCT: 41 % (ref 39.0–52.0)
Hemoglobin: 13.9 g/dL (ref 13.0–17.0)
POTASSIUM: 4.7 mmol/L (ref 3.5–5.1)
SODIUM: 139 mmol/L (ref 135–145)
TCO2: 25 mmol/L (ref 22–32)

## 2017-08-10 LAB — CDS SEROLOGY

## 2017-08-10 LAB — I-STAT TROPONIN, ED: Troponin i, poc: 0 ng/mL (ref 0.00–0.08)

## 2017-08-10 LAB — I-STAT CG4 LACTIC ACID, ED: LACTIC ACID, VENOUS: 1.89 mmol/L (ref 0.5–1.9)

## 2017-08-10 LAB — SAMPLE TO BLOOD BANK

## 2017-08-10 LAB — PROTIME-INR
INR: 0.94
Prothrombin Time: 12.5 seconds (ref 11.4–15.2)

## 2017-08-10 LAB — ETHANOL: Alcohol, Ethyl (B): <10 mg/dL (ref <10)

## 2017-08-10 LAB — CBG MONITORING, ED
GLUCOSE-CAPILLARY: 198 mg/dL — AB (ref 65–99)
Glucose-Capillary: 159 mg/dL — ABNORMAL HIGH (ref 65–99)

## 2017-08-10 MED ORDER — GABAPENTIN 300 MG PO CAPS
300.0000 mg | ORAL_CAPSULE | Freq: Three times a day (TID) | ORAL | Status: DC
  Administered 2017-08-11 – 2017-08-12 (×5): 300 mg via ORAL
  Filled 2017-08-10 (×5): qty 1

## 2017-08-10 MED ORDER — ONDANSETRON 4 MG PO TBDP: 4.0000 mg | ORAL_TABLET | Freq: Four times a day (QID) | ORAL | Status: DC | PRN

## 2017-08-10 MED ORDER — FENTANYL CITRATE (PF) 100 MCG/2ML IJ SOLN
50.0000 ug | Freq: Once | INTRAMUSCULAR | Status: AC
  Administered 2017-08-10: 50 ug via INTRAVENOUS
  Filled 2017-08-10: qty 2

## 2017-08-10 MED ORDER — METOPROLOL TARTRATE 5 MG/5ML IV SOLN: 5.0000 mg | Freq: Four times a day (QID) | INTRAVENOUS | Status: DC | PRN

## 2017-08-10 MED ORDER — ONDANSETRON HCL 4 MG/2ML IJ SOLN
4.0000 mg | Freq: Four times a day (QID) | INTRAMUSCULAR | Status: DC | PRN
  Administered 2017-08-11 (×2): 4 mg via INTRAVENOUS
  Filled 2017-08-10 (×2): qty 2

## 2017-08-10 MED ORDER — INSULIN ASPART 100 UNIT/ML ~~LOC~~ SOLN
0.0000 [IU] | Freq: Three times a day (TID) | SUBCUTANEOUS | Status: DC
  Administered 2017-08-11 (×2): 2 [IU] via SUBCUTANEOUS
  Administered 2017-08-11: 9 [IU] via SUBCUTANEOUS
  Administered 2017-08-12 (×2): 3 [IU] via SUBCUTANEOUS

## 2017-08-10 MED ORDER — ENOXAPARIN SODIUM 30 MG/0.3ML ~~LOC~~ SOLN
30.0000 mg | Freq: Two times a day (BID) | SUBCUTANEOUS | Status: DC
  Administered 2017-08-11 – 2017-08-12 (×3): 30 mg via SUBCUTANEOUS
  Filled 2017-08-10 (×3): qty 0.3

## 2017-08-10 MED ORDER — BISACODYL 10 MG RE SUPP: 10.0000 mg | Freq: Every day | RECTAL | Status: DC | PRN

## 2017-08-10 MED ORDER — IOPAMIDOL (ISOVUE-300) INJECTION 61%
INTRAVENOUS | Status: AC
  Administered 2017-08-10: 75 mL
  Filled 2017-08-10: qty 75

## 2017-08-10 MED ORDER — ALPRAZOLAM 0.25 MG PO TABS
0.2500 mg | ORAL_TABLET | Freq: Every day | ORAL | Status: DC | PRN
  Administered 2017-08-10: 0.25 mg via ORAL
  Filled 2017-08-10: qty 1

## 2017-08-10 MED ORDER — HYDROMORPHONE HCL 1 MG/ML IJ SOLN
0.5000 mg | INTRAMUSCULAR | Status: DC | PRN
  Administered 2017-08-10 – 2017-08-11 (×2): 0.5 mg via INTRAVENOUS
  Filled 2017-08-10: qty 0.5
  Filled 2017-08-10: qty 1

## 2017-08-10 MED ORDER — OXYCODONE HCL 5 MG PO TABS
5.0000 mg | ORAL_TABLET | ORAL | Status: DC | PRN
  Administered 2017-08-10: 5 mg via ORAL
  Filled 2017-08-10: qty 1

## 2017-08-10 MED ORDER — PANTOPRAZOLE SODIUM 40 MG IV SOLR
40.0000 mg | Freq: Every day | INTRAVENOUS | Status: DC
  Administered 2017-08-10: 40 mg via INTRAVENOUS
  Filled 2017-08-10: qty 40

## 2017-08-10 MED ORDER — METHOCARBAMOL 500 MG PO TABS: 500.0000 mg | ORAL_TABLET | Freq: Four times a day (QID) | ORAL | Status: DC | PRN

## 2017-08-10 MED ORDER — HYDRALAZINE HCL 20 MG/ML IJ SOLN: 10.0000 mg | INTRAMUSCULAR | Status: DC | PRN

## 2017-08-10 MED ORDER — ACETAMINOPHEN 325 MG PO TABS
650.0000 mg | ORAL_TABLET | Freq: Four times a day (QID) | ORAL | Status: DC
  Administered 2017-08-10 – 2017-08-11 (×2): 650 mg via ORAL
  Filled 2017-08-10 (×2): qty 2

## 2017-08-10 MED ORDER — PANTOPRAZOLE SODIUM 40 MG PO TBEC
40.0000 mg | DELAYED_RELEASE_TABLET | Freq: Every day | ORAL | Status: DC
  Administered 2017-08-11 – 2017-08-12 (×2): 40 mg via ORAL
  Filled 2017-08-10 (×3): qty 1

## 2017-08-10 MED ORDER — SODIUM CHLORIDE 0.9 % IV SOLN
INTRAVENOUS | Status: DC
  Administered 2017-08-10 – 2017-08-12 (×3): via INTRAVENOUS

## 2017-08-10 MED ORDER — IBUPROFEN 800 MG PO TABS: 800.0000 mg | ORAL_TABLET | Freq: Three times a day (TID) | ORAL | Status: DC

## 2017-08-10 MED ORDER — SODIUM CHLORIDE 0.9 % IV BOLUS (SEPSIS)
500.0000 mL | Freq: Once | INTRAVENOUS | Status: AC
  Administered 2017-08-10: 500 mL via INTRAVENOUS

## 2017-08-10 MED ORDER — DOCUSATE SODIUM 100 MG PO CAPS
100.0000 mg | ORAL_CAPSULE | Freq: Two times a day (BID) | ORAL | Status: DC
  Administered 2017-08-11 – 2017-08-12 (×4): 100 mg via ORAL
  Filled 2017-08-10 (×4): qty 1

## 2017-08-10 NOTE — ED Notes (Signed)
Med given 

## 2017-08-10 NOTE — ED Notes (Signed)
The pts pain is better

## 2017-08-10 NOTE — ED Triage Notes (Signed)
The [place] he [place] approx  10 feet.  He was dizzy which he has often.  He lay his head against a tree and he fainted.  bp in the 80 s then a few minutes later his bp was 136s  Alert on arrival skin warm and dry no distress   C/o rt shoulder pain  Rt posterior chest pain some lower back and flank pain  Iv per ems  They gave 39ml nss zofran 4mg  and fentanyl  50 mcg

## 2017-08-10 NOTE — ED Notes (Signed)
The pt just returned from  c-t 

## 2017-08-10 NOTE — Consult Note (Signed)
Reason for Consult: T7 and T10 mild compression fractures Referring Physician: Trauma  Gerald Ray is an 71 y.o. male.  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  Past Medical History:  Diagnosis Date  . Diabetes mellitus without complication ([place])   . Hypertension   . Renal disorder     History reviewed. No pertinent surgical history.  No family history on file.  Social History:  reports that  has never smoked. he has never used smokeless tobacco. His alcohol and drug histories are not on file.  Allergies: No Known Allergies  Medications: I have reviewed the patient's current medications.  Results for orders placed or performed during the hospital encounter of 08/10/17 (from the past 48 hour(s))  CBG monitoring, ED     Status: Abnormal   Collection Time: 08/10/17  4:18 PM  Result Value Ref Range   Glucose-Capillary 198 (H) 65 - 99 mg/dL  CDS serology     Status: None   Collection Time: 08/10/17  4:20 PM  Result Value Ref Range   CDS serology specimen      SPECIMEN WILL BE HELD FOR 14 DAYS IF TESTING IS REQUIRED  Comprehensive metabolic panel     Status: Abnormal   Collection Time: 08/10/17  4:20 PM  Result Value Ref Range   Sodium 137 135 - 145 mmol/L   Potassium 4.8 3.5 - 5.1 mmol/L    Comment: SLIGHT HEMOLYSIS   Chloride 105 101 - 111 mmol/L   CO2 23 22 - 32 mmol/L   Glucose, Bld 231 (H) 65 - 99 mg/dL   BUN 25 (H) 6 - 20 mg/dL   Creatinine, Ser 1.60 (H) 0.61 - 1.24 mg/dL   Calcium 9.1 8.9 - 10.3 mg/dL   Total Protein 7.2 6.5 - 8.1 g/dL   Albumin 3.9 3.5 - 5.0 g/dL   AST 57 (H) 15 - 41 U/L   ALT 35 17 - 63 U/L   Alkaline Phosphatase 44 38 - 126 U/L   Total Bilirubin 0.8 0.3 - 1.2 mg/dL   GFR calc non Af Amer 41 (L) >60 mL/min   GFR calc Af Amer 48 (L) >60 mL/min    Comment: (NOTE) The eGFR has been calculated  using the CKD EPI equation. This calculation has not been validated in all clinical situations. eGFR's persistently <60 mL/min signify possible Chronic Kidney Disease.    Anion gap 9 5 - 15  CBC     Status: Abnormal   Collection Time: 08/10/17  4:20 PM  Result Value Ref Range   WBC 14.7 (H) 4.0 - 10.5 K/uL   RBC 4.60 4.22 - 5.81 MIL/uL   Hemoglobin 13.0 13.0 - 17.0 g/dL   HCT 39.4 39.0 - 52.0 %   MCV 85.7 78.0 - 100.0 fL   MCH 28.3 26.0 - 34.0 pg   MCHC 33.0 30.0 - 36.0 g/dL   RDW 13.0 11.5 - 15.5 %   Platelets 178 150 - 400 K/uL  Ethanol     Status: None   Collection Time: 08/10/17  4:20 PM  Result Value Ref Range   Alcohol, Ethyl (B) <10 <10 mg/dL    Comment:        LOWEST DETECTABLE LIMIT FOR SERUM ALCOHOL IS 10 mg/dL FOR MEDICAL PURPOSES ONLY   Protime-INR     Status: None   Collection Time:  08/10/17  4:20 PM  Result Value Ref Range   Prothrombin Time 12.5 11.4 - 15.2 seconds   INR 0.94   Sample to Blood Bank     Status: None   Collection Time: 08/10/17  4:20 PM  Result Value Ref Range   Blood Bank Specimen SAMPLE AVAILABLE FOR TESTING    Sample Expiration 08/11/2017   I-Stat Troponin, ED     Status: None   Collection Time: 08/10/17  4:29 PM  Result Value Ref Range   Troponin i, poc 0.00 0.00 - 0.08 ng/mL   Comment 3            Comment: Due to the release kinetics of cTnI, a negative result within the first hours of the onset of symptoms does not rule out myocardial infarction with certainty. If myocardial infarction is still suspected, repeat the test at appropriate intervals.   I-Stat Chem 8, ED     Status: Abnormal   Collection Time: 08/10/17  4:31 PM  Result Value Ref Range   Sodium 139 135 - 145 mmol/L   Potassium 4.7 3.5 - 5.1 mmol/L   Chloride 102 101 - 111 mmol/L   BUN 28 (H) 6 - 20 mg/dL   Creatinine, Ser 1.50 (H) 0.61 - 1.24 mg/dL   Glucose, Bld 226 (H) 65 - 99 mg/dL   Calcium, Ion 1.15 1.15 - 1.40 mmol/L   TCO2 25 22 - 32 mmol/L   Hemoglobin  13.9 13.0 - 17.0 g/dL   HCT 41.0 39.0 - 52.0 %  I-Stat CG4 Lactic Acid, ED     Status: None   Collection Time: 08/10/17  4:31 PM  Result Value Ref Range   Lactic Acid, Venous 1.89 0.5 - 1.9 mmol/L    Ct Head Wo Contrast  Result Date: 08/10/2017 CLINICAL DATA:  Dizzy, fall EXAM: CT HEAD WITHOUT CONTRAST CT CERVICAL SPINE WITHOUT CONTRAST TECHNIQUE: Multidetector CT imaging of the head and cervical spine was performed following the standard protocol without intravenous contrast. Multiplanar CT image reconstructions of the cervical spine were also generated. COMPARISON:  None. FINDINGS: CT HEAD FINDINGS Brain: No acute territorial infarction, hemorrhage or intracranial mass is visualized. Mild atrophy. Nonenlarged ventricles. Vascular: No hyperdense vessels. Carotid artery calcification. Vertebral artery calcification. Skull: No depressed skull fracture.  Mastoid air cells are clear. Sinuses/Orbits: No acute finding. Other: Soft tissue emphysema in the right posterior neck and around the right carotid vessels. CT CERVICAL SPINE FINDINGS Alignment: Trace anterolisthesis of C4 on C5. Facet alignment is within normal limits. Skull base and vertebrae: No acute fracture. No primary bone lesion or focal pathologic process. Soft tissues and spinal canal: No prevertebral fluid or swelling. No visible canal hematoma. Disc levels: Marked degenerative changes at C6-C7. Mild degenerative changes at C3-C4 and C4-C5. Upper chest: Moderate soft tissue emphysema in the right neck and paraspinal region. Probable tiny focus of pneumothorax anteriorly at right apex but incompletely visualized. Other: None IMPRESSION: 1. No CT evidence for acute intracranial abnormality. 2. Trace anterolisthesis of C4 on C5, possibly degenerative. No fracture seen 3. Moderate soft tissue emphysema in the right neck and paraspinal region. Probable tiny right anterior apical pneumothorax but incompletely visualized. See dedicated chest CT.  Electronically Signed   By: Donavan Foil M.D.   On: 08/10/2017 18:12   Ct Chest W Contrast  Result Date: 08/10/2017 CLINICAL DATA:  Fall from deer stand today, dizziness, loss of consciousness. Right-sided chest pain. EXAM: CT CHEST WITH CONTRAST TECHNIQUE: Multidetector CT imaging of the  chest was performed during intravenous contrast administration. CONTRAST:  34m ISOVUE-300 IOPAMIDOL (ISOVUE-300) INJECTION 61% COMPARISON:  None. FINDINGS: Cardiovascular: Thoracic aorta appears intact and normal in configuration. Heart size is normal. No significant pericardial effusion seen. Aortic atherosclerosis.  Coronary artery calcifications. Mediastinum/Nodes: No hemorrhage or edema appreciated within the mediastinum. No mass or enlarged lymph nodes seen within the mediastinum or perihilar regions. Hiatal hernia, small to moderate in size. Trachea and central bronchi are unremarkable. Lungs/Pleura: Small right-sided pneumothorax, appreciated at both the lung base and lung apex. Small right pleural effusion, presumed hemothorax. Associated dependent atelectasis within the right lung. Additional mild atelectasis at the left lung base. Upper Abdomen: No acute solid organ abnormality seen. No free intraperitoneal air. Musculoskeletal: Mild compression fracture deformity involving the superior endplate of the TO67vertebral body. Additional compression fracture deformity of the T7 vertebral body. Slightly displaced fractures of the posterior right sixth through tenth ribs. Additional displaced fracture of the lateral right fifth and sixth ribs, also acute in appearance. Additional fracture deformities within the right posterior fifth rib appears old. IMPRESSION: 1. Small right-sided pneumothorax. Associated small right-sided hemothorax and atelectasis. 2. Slightly displaced fractures of the posterior right sixth through tenth ribs. Additional displaced fractures of the lateral right fifth and sixth ribs. 3. Acute mild  compression fracture deformity involving the superior endplate of the TT24vertebral body. Additional compression fracture deformity of the T7 vertebral body, also acute. Both compression fractures with approximately 10-20% loss of vertebral body height. No associated vertebral body displacement or retropulsion. Both fractures involves the anterior and middle columns. No extension to the posterior elements at either level. 4. Aortic atherosclerosis.  No evidence of aortic injury. Critical Value/emergent results were called by telephone at the time of interpretation on 08/10/2017 at 6:15 pm to Dr. CZenovia Jarred, who verbally acknowledged these results. Electronically Signed   By: SFranki CabotM.D.   On: 08/10/2017 18:19   Ct Cervical Spine Wo Contrast  Result Date: 08/10/2017 CLINICAL DATA:  Dizzy, fall EXAM: CT HEAD WITHOUT CONTRAST CT CERVICAL SPINE WITHOUT CONTRAST TECHNIQUE: Multidetector CT imaging of the head and cervical spine was performed following the standard protocol without intravenous contrast. Multiplanar CT image reconstructions of the cervical spine were also generated. COMPARISON:  None. FINDINGS: CT HEAD FINDINGS Brain: No acute territorial infarction, hemorrhage or intracranial mass is visualized. Mild atrophy. Nonenlarged ventricles. Vascular: No hyperdense vessels. Carotid artery calcification. Vertebral artery calcification. Skull: No depressed skull fracture.  Mastoid air cells are clear. Sinuses/Orbits: No acute finding. Other: Soft tissue emphysema in the right posterior neck and around the right carotid vessels. CT CERVICAL SPINE FINDINGS Alignment: Trace anterolisthesis of C4 on C5. Facet alignment is within normal limits. Skull base and vertebrae: No acute fracture. No primary bone lesion or focal pathologic process. Soft tissues and spinal canal: No prevertebral fluid or swelling. No visible canal hematoma. Disc levels: Marked degenerative changes at C6-C7. Mild degenerative  changes at C3-C4 and C4-C5. Upper chest: Moderate soft tissue emphysema in the right neck and paraspinal region. Probable tiny focus of pneumothorax anteriorly at right apex but incompletely visualized. Other: None IMPRESSION: 1. No CT evidence for acute intracranial abnormality. 2. Trace anterolisthesis of C4 on C5, possibly degenerative. No fracture seen 3. Moderate soft tissue emphysema in the right neck and paraspinal region. Probable tiny right anterior apical pneumothorax but incompletely visualized. See dedicated chest CT. Electronically Signed   By: KDonavan FoilM.D.   On: 08/10/2017 18:12   Dg Chest  Port 1 View  Result Date: 08/10/2017 CLINICAL DATA:  Patient fell 10-15 feet from a tree stand. The patient is complaining of difficulty taking a deep breath. EXAM: PORTABLE CHEST 1 VIEW COMPARISON:  None in PACs FINDINGS: The lungs are borderline hypoinflated. The interstitial markings are coarse especially at the left base. There is no pneumothorax or pneumomediastinum or significant pleural effusion. The cardiac silhouette is top-normal in size. The pulmonary vascularity is normal. There is calcification in the wall of the aortic arch. The observed portions of the ribs appear intact. There is surgical suture material projecting over the midshaft of the left clavicle but no acute clavicular fracture is observed. The observed portions of the shoulders are normal. IMPRESSION: Probable bibasilar atelectasis. No pneumothorax or evidence of pulmonary contusion. If there are clinical concerns of significant occult intrathoracic injury, chest CT scanning is recommended. Thoracic aortic atherosclerosis the Electronically Signed   By: David  Martinique M.D.   On: 08/10/2017 16:33    Review of Systems  Musculoskeletal: Positive for back pain.   Blood pressure 127/72, pulse 98, temperature (!) 97.1 F (36.2 C), resp. rate 16, height _0  (1.727 m), weight 80.7 kg (178 lb), SpO2 98 %. Physical Exam   Constitutional: He is oriented to person, place, and time.  Neurological: He is alert and oriented to person, place, and time. He has normal strength. GCS eye subscore is 4. GCS verbal subscore is 5. GCS motor subscore is 6.  Patient is awake and alert strength 5 out of 5 iliopsoas, quads, hamstrings, gastric, into tibialis, and EHL. No clonus downgoing toes reflexes normal    Assessment/Plan: 71 year old with mild compression deformities minimal loss of height and minimal to no kyphosis no significant canal compromise. It's okay to mobilize the patient and depending on pain can get him a thoracic brace for pain relief would be worthwhile checking an upright x-ray once he gets his brace or when he is mobilized tomorrow. This appears to be nonsurgical  Blessin Kanno P 08/10/2017, 8:51 PM

## 2017-08-10 NOTE — ED Notes (Signed)
Pt moved to pod f rm 7

## 2017-08-10 NOTE — ED Notes (Signed)
The family has gone home

## 2017-08-10 NOTE — ED Notes (Signed)
To x-ray

## 2017-08-10 NOTE — ED Notes (Signed)
Port chest xray just when he arrived

## 2017-08-10 NOTE — ED Notes (Signed)
Dr Windle Guard here to see

## 2017-08-10 NOTE — H&P (Signed)
Surgical H&P  CC: fall  HPI: 71 year old gentleman came in as a trauma to following syncopal event and fall from deer stand. He was on the deer scan and felt dizzy, leaned his head against the tree and then lost consciousness. When he woke up he was on the ground. Complains of pain in the right lateral chest wall. Denies any current dizziness or nausea. Denies abdominal pain. Denies neck pain or joint pain. He denies any prior syncopal events but he does state that occasionally he has some dizziness and he attributes this to recently starting metformin.  No Known Allergies  Past Medical History:  Diagnosis Date  . Diabetes mellitus without complication ([place])   . Hypertension   . Renal disorder     History reviewed. No pertinent surgical history. He's had a hernia repair on the left as well as shoulder surgery on the left  No family history on file.  Social History   Socioeconomic History  . Marital status: Divorced    Spouse name: None  . Number of children: None  . Years of education: None  . Highest education level: None  Social Needs  . Financial resource strain: None  . Food insecurity - worry: None  . Food insecurity - inability: None  . Transportation needs - medical: None  . Transportation needs - non-medical: None  Occupational History  . None  Tobacco Use  . Smoking status: Never Smoker  . Smokeless tobacco: Never Used  Substance and Sexual Activity  . Alcohol use: None  . Drug use: None  . Sexual activity: No  Other Topics Concern  . None  Social History Narrative  . None    No current facility-administered medications on file prior to encounter.    Current Outpatient Medications on File Prior to Encounter  Medication Sig Dispense Refill  . ALPRAZolam (XANAX) 0.25 MG tablet Take 0.25 mg by mouth daily as needed.    . Alum & Mag Hydroxide-Simeth (ANTACID/ANTI-GAS PO) Take 1 tablet by mouth as needed.    Marland Kitchen glimepiride (AMARYL) 1 MG tablet Take 1 mg by  mouth daily.    Marland Kitchen lisinopril (PRINIVIL,ZESTRIL) 20 MG tablet Take 20 mg by mouth daily.    . metFORMIN (GLUCOPHAGE) 1000 MG tablet Take 1,000 mg by mouth 2 (two) times daily.    . simvastatin (ZOCOR) 40 MG tablet Take 40 mg by mouth daily.      Review of Systems: a complete, 10pt review of systems was completed with pertinent positives and negatives as documented in the HPI  Physical Exam: Vitals:   08/10/17 1800 08/10/17 1830  BP: 131/72 136/80  Pulse: 94 97  Resp: (!) 23 (!) 26  Temp:    SpO2: 99% 99%   Gen: A&Ox3, no distress  Head: normocephalic, atraumatic, EOMI, anicteric.  Neck: supple without mass or thyromegaly, no midline tenderness Chest: unlabored respirations, symmetrical air entry, tender on right chest wall  Cardiovascular: RRR with palpable distal pulses, no pedal edema Abdomen: soft, nondistended, nontender. No mass or organomegaly.  Extremities: warm, without edema, no deformities  Neuro: grossly intact, GCS 15 Psych: appropriate mood and affect, normal insight Skin: warm and dry   CBC Latest Ref Rng & Units 08/10/2017 08/10/2017  WBC 4.0 - 10.5 K/uL - 14.7(H)  Hemoglobin 13.0 - 17.0 g/dL 13.9 13.0  Hematocrit 39.0 - 52.0 % 41.0 39.4  Platelets 150 - 400 K/uL - 178    CMP Latest Ref Rng & Units 08/10/2017 08/10/2017  Glucose 65 -  99 mg/dL 226(H) 231(H)  BUN 6 - 20 mg/dL 28(H) 25(H)  Creatinine 0.61 - 1.24 mg/dL 1.50(H) 1.60(H)  Sodium 135 - 145 mmol/L 139 137  Potassium 3.5 - 5.1 mmol/L 4.7 4.8  Chloride 101 - 111 mmol/L 102 105  CO2 22 - 32 mmol/L - 23  Calcium 8.9 - 10.3 mg/dL - 9.1  Total Protein 6.5 - 8.1 g/dL - 7.2  Total Bilirubin 0.3 - 1.2 mg/dL - 0.8  Alkaline Phos 38 - 126 U/L - 44  AST 15 - 41 U/L - 57(H)  ALT 17 - 63 U/L - 35    Lab Results  Component Value Date   INR 0.94 08/10/2017    Imaging: Ct Head Wo Contrast  Result Date: 08/10/2017 CLINICAL DATA:  Dizzy, fall EXAM: CT HEAD WITHOUT CONTRAST CT CERVICAL SPINE WITHOUT  CONTRAST TECHNIQUE: Multidetector CT imaging of the head and cervical spine was performed following the standard protocol without intravenous contrast. Multiplanar CT image reconstructions of the cervical spine were also generated. COMPARISON:  None. FINDINGS: CT HEAD FINDINGS Brain: No acute territorial infarction, hemorrhage or intracranial mass is visualized. Mild atrophy. Nonenlarged ventricles. Vascular: No hyperdense vessels. Carotid artery calcification. Vertebral artery calcification. Skull: No depressed skull fracture.  Mastoid air cells are clear. Sinuses/Orbits: No acute finding. Other: Soft tissue emphysema in the right posterior neck and around the right carotid vessels. CT CERVICAL SPINE FINDINGS Alignment: Trace anterolisthesis of C4 on C5. Facet alignment is within normal limits. Skull base and vertebrae: No acute fracture. No primary bone lesion or focal pathologic process. Soft tissues and spinal canal: No prevertebral fluid or swelling. No visible canal hematoma. Disc levels: Marked degenerative changes at C6-C7. Mild degenerative changes at C3-C4 and C4-C5. Upper chest: Moderate soft tissue emphysema in the right neck and paraspinal region. Probable tiny focus of pneumothorax anteriorly at right apex but incompletely visualized. Other: None IMPRESSION: 1. No CT evidence for acute intracranial abnormality. 2. Trace anterolisthesis of C4 on C5, possibly degenerative. No fracture seen 3. Moderate soft tissue emphysema in the right neck and paraspinal region. Probable tiny right anterior apical pneumothorax but incompletely visualized. See dedicated chest CT. Electronically Signed   By: Donavan Foil M.D.   On: 08/10/2017 18:12   Ct Chest W Contrast  Result Date: 08/10/2017 CLINICAL DATA:  Fall from deer stand today, dizziness, loss of consciousness. Right-sided chest pain. EXAM: CT CHEST WITH CONTRAST TECHNIQUE: Multidetector CT imaging of the chest was performed during intravenous contrast  administration. CONTRAST:  78mL ISOVUE-300 IOPAMIDOL (ISOVUE-300) INJECTION 61% COMPARISON:  None. FINDINGS: Cardiovascular: Thoracic aorta appears intact and normal in configuration. Heart size is normal. No significant pericardial effusion seen. Aortic atherosclerosis.  Coronary artery calcifications. Mediastinum/Nodes: No hemorrhage or edema appreciated within the mediastinum. No mass or enlarged lymph nodes seen within the mediastinum or perihilar regions. Hiatal hernia, small to moderate in size. Trachea and central bronchi are unremarkable. Lungs/Pleura: Small right-sided pneumothorax, appreciated at both the lung base and lung apex. Small right pleural effusion, presumed hemothorax. Associated dependent atelectasis within the right lung. Additional mild atelectasis at the left lung base. Upper Abdomen: No acute solid organ abnormality seen. No free intraperitoneal air. Musculoskeletal: Mild compression fracture deformity involving the superior endplate of the G25 vertebral body. Additional compression fracture deformity of the T7 vertebral body. Slightly displaced fractures of the posterior right sixth through tenth ribs. Additional displaced fracture of the lateral right fifth and sixth ribs, also acute in appearance. Additional fracture deformities within the right  posterior fifth rib appears old. IMPRESSION: 1. Small right-sided pneumothorax. Associated small right-sided hemothorax and atelectasis. 2. Slightly displaced fractures of the posterior right sixth through tenth ribs. Additional displaced fractures of the lateral right fifth and sixth ribs. 3. Acute mild compression fracture deformity involving the superior endplate of the W23 vertebral body. Additional compression fracture deformity of the T7 vertebral body, also acute. Both compression fractures with approximately 10-20% loss of vertebral body height. No associated vertebral body displacement or retropulsion. Both fractures involves the  anterior and middle columns. No extension to the posterior elements at either level. 4. Aortic atherosclerosis.  No evidence of aortic injury. Critical Value/emergent results were called by telephone at the time of interpretation on 08/10/2017 at 6:15 pm to Dr. Zenovia Jarred , who verbally acknowledged these results. Electronically Signed   By: Franki Cabot M.D.   On: 08/10/2017 18:19   Ct Cervical Spine Wo Contrast  Result Date: 08/10/2017 CLINICAL DATA:  Dizzy, fall EXAM: CT HEAD WITHOUT CONTRAST CT CERVICAL SPINE WITHOUT CONTRAST TECHNIQUE: Multidetector CT imaging of the head and cervical spine was performed following the standard protocol without intravenous contrast. Multiplanar CT image reconstructions of the cervical spine were also generated. COMPARISON:  None. FINDINGS: CT HEAD FINDINGS Brain: No acute territorial infarction, hemorrhage or intracranial mass is visualized. Mild atrophy. Nonenlarged ventricles. Vascular: No hyperdense vessels. Carotid artery calcification. Vertebral artery calcification. Skull: No depressed skull fracture.  Mastoid air cells are clear. Sinuses/Orbits: No acute finding. Other: Soft tissue emphysema in the right posterior neck and around the right carotid vessels. CT CERVICAL SPINE FINDINGS Alignment: Trace anterolisthesis of C4 on C5. Facet alignment is within normal limits. Skull base and vertebrae: No acute fracture. No primary bone lesion or focal pathologic process. Soft tissues and spinal canal: No prevertebral fluid or swelling. No visible canal hematoma. Disc levels: Marked degenerative changes at C6-C7. Mild degenerative changes at C3-C4 and C4-C5. Upper chest: Moderate soft tissue emphysema in the right neck and paraspinal region. Probable tiny focus of pneumothorax anteriorly at right apex but incompletely visualized. Other: None IMPRESSION: 1. No CT evidence for acute intracranial abnormality. 2. Trace anterolisthesis of C4 on C5, possibly degenerative.  No fracture seen 3. Moderate soft tissue emphysema in the right neck and paraspinal region. Probable tiny right anterior apical pneumothorax but incompletely visualized. See dedicated chest CT. Electronically Signed   By: Donavan Foil M.D.   On: 08/10/2017 18:12   Dg Chest Port 1 View  Result Date: 08/10/2017 CLINICAL DATA:  Patient fell 10-15 feet from a tree stand. The patient is complaining of difficulty taking a deep breath. EXAM: PORTABLE CHEST 1 VIEW COMPARISON:  None in PACs FINDINGS: The lungs are borderline hypoinflated. The interstitial markings are coarse especially at the left base. There is no pneumothorax or pneumomediastinum or significant pleural effusion. The cardiac silhouette is top-normal in size. The pulmonary vascularity is normal. There is calcification in the wall of the aortic arch. The observed portions of the ribs appear intact. There is surgical suture material projecting over the midshaft of the left clavicle but no acute clavicular fracture is observed. The observed portions of the shoulders are normal. IMPRESSION: Probable bibasilar atelectasis. No pneumothorax or evidence of pulmonary contusion. If there are clinical concerns of significant occult intrathoracic injury, chest CT scanning is recommended. Thoracic aortic atherosclerosis the Electronically Signed   By: David  Martinique M.D.   On: 08/10/2017 16:66    A/P: 71 year old man status post fall from deer stand, approximately  15-20 feet -Right 6 through 10 posterior rib fractures, right 5 through 6 lateral rib fractures, small right hemopneumothorax: Admit for monitoring, and sent spirometry/aggressive pulmonary toilet, aggressive multimodal pain control; hold ibuprofen due to chronic renal insufficiency. Repeat x-ray in the morning. -T10 superior endplate compression fracture, T7 vertebral body compression fracture- Dr. Thomasene Lot to call spine consult.  -PT, OT, SLP tomorrow  -Diabetes: sliding scale insulin -HTN- hold  home BP meds for now -Syncopal episode: outpt workup per PCP   Romana Juniper, MD [facility] Surgery, [place] Pager [phone]

## 2017-08-10 NOTE — Progress Notes (Signed)
Orthopedic Tech Progress Note Patient Details:  Gerald Ray 1946/03/11 [id number]  Patient ID: Gerald Ray, male   DOB: 10/18/1945, 71 y.o.   MRN: [id number]   G[facility] Ray 08/10/2017, 4:23 PM Made level 2 trauma visit

## 2017-08-10 NOTE — ED Notes (Signed)
P[ain not too bad at present

## 2017-08-10 NOTE — ED Provider Notes (Signed)
[facility] Provider Note   CSN: [id number] Arrival date & time: 08/10/17  1615     History   Chief Complaint Chief Complaint  Patient presents with  . Trauma    HPI Gerald Ray is a 71 y.o. male.  HPI   Patient is a 71 year old male presenting with fall from deer stand.  Patient reports that he went up to the deer stand today.  He felt very dizzy on arrival to the deer stand.  Rest of his head against the tree and then woke up on the ground.  Initial blood pressure was low per EMS however it was immediately better after no intervention at all.  He is alert and oriented x3.  Complaining of pain to the right posterior chest wall.  Patient diabetic.  He reports he ate breakfast but nothing else.  Patient sugar 230s per EMS.  Past Medical History:  Diagnosis Date  . Diabetes mellitus without complication ([place])   . Hypertension   . Renal disorder     There are no active problems to display for this patient.   History reviewed. No pertinent surgical history.     Home Medications    Prior to Admission medications   Not on File    Family History No family history on file.  Social History Social History   Tobacco Use  . Smoking status: Never Smoker  . Smokeless tobacco: Never Used  Substance Use Topics  . Alcohol use: Not on file  . Drug use: Not on file     Allergies   Patient has no known allergies.   Review of Systems Review of Systems  Constitutional: Negative for activity change.  Respiratory: Positive for chest tightness. Negative for shortness of breath.   Cardiovascular: Positive for chest pain.  Gastrointestinal: Negative for abdominal pain.  Neurological: Positive for dizziness.  All other systems reviewed and are negative.    Physical Exam Updated Vital Signs BP 113/60   Pulse 95   Temp (!) 97.1 F (36.2 C)   Resp 16   Ht 5\' 8"  (1.727 m)   Wt 80.7 kg (178 lb)   SpO2 97%   BMI 27.06  kg/m   Physical Exam  Constitutional: He is oriented to person, place, and time. He appears well-nourished.  HENT:  Head: Normocephalic and atraumatic.  Hair piece in place.  Eyes: Conjunctivae are normal. Right eye exhibits no discharge. Left eye exhibits no discharge.  Neck: Normal range of motion. Neck supple.  Cardiovascular: Normal rate and regular rhythm.  Pulmonary/Chest: Effort normal and breath sounds normal. No respiratory distress.  Abdominal: Soft. He exhibits no distension. There is no tenderness.  Musculoskeletal: Normal range of motion. He exhibits no edema or deformity.  Neurological: He is oriented to person, place, and time.  Skin: Skin is warm and dry. He is not diaphoretic.  Psychiatric: He has a normal mood and affect. His behavior is normal.     ED Treatments / Results  Labs (all labs ordered are listed, but only abnormal results are displayed) Labs Reviewed  CBC - Abnormal; Notable for the following components:      Result Value   WBC 14.7 (*)    All other components within normal limits  I-STAT CHEM 8, ED - Abnormal; Notable for the following components:   BUN 28 (*)    Creatinine, Ser 1.50 (*)    Glucose, Bld 226 (*)    All other components within normal limits  CBG MONITORING, ED - Abnormal; Notable for the following components:   Glucose-Capillary 198 (*)    All other components within normal limits  CDS SEROLOGY  COMPREHENSIVE METABOLIC PANEL  ETHANOL  URINALYSIS, ROUTINE W REFLEX MICROSCOPIC  PROTIME-INR  I-STAT CG4 LACTIC ACID, ED  I-STAT TROPONIN, ED  SAMPLE TO BLOOD BANK    EKG  EKG Interpretation None       Radiology Dg Chest Port 1 View  Result Date: 08/10/2017 CLINICAL DATA:  Patient fell 10-15 feet from a tree stand. The patient is complaining of difficulty taking a deep breath. EXAM: PORTABLE CHEST 1 VIEW COMPARISON:  None in PACs FINDINGS: The lungs are borderline hypoinflated. The interstitial markings are coarse  especially at the left base. There is no pneumothorax or pneumomediastinum or significant pleural effusion. The cardiac silhouette is top-normal in size. The pulmonary vascularity is normal. There is calcification in the wall of the aortic arch. The observed portions of the ribs appear intact. There is surgical suture material projecting over the midshaft of the left clavicle but no acute clavicular fracture is observed. The observed portions of the shoulders are normal. IMPRESSION: Probable bibasilar atelectasis. No pneumothorax or evidence of pulmonary contusion. If there are clinical concerns of significant occult intrathoracic injury, chest CT scanning is recommended. Thoracic aortic atherosclerosis the Electronically Signed   By: David  Martinique M.D.   On: 08/10/2017 16:33    Procedures Procedures (including critical care time)  Medications Ordered in ED Medications  sodium chloride 0.9 % bolus 500 mL (500 mLs Intravenous New Bag/Given 08/10/17 1632)     Initial Impression / Assessment and Plan / ED Course  I have reviewed the triage vital signs and the nursing notes.  Pertinent labs & imaging results that were available during my care of the patient were reviewed by me and considered in my medical decision making (see chart for details).    Level 2 trauma after complete syncope in a tree stand.  Concern for chest wall trauma.  Will get CT of the chest.  CT head and neck given age and mechanism.  CT shows multiple rib fractures.  Vertebral fractures.  Discussed with Dr. Saintclair Halsted from neurosurgery, he will follow for the vertebral fractures in the morning.  Likely non-operative.  Discussed with trauma surgery, they will admit for pain control, monitoring of the small pneumothorax seen on CAT scan.  Will continue to treat patient's pain, incentive spirometer ordered.   Final Clinical Impressions(s) / ED Diagnoses   Final diagnoses:  None    ED Discharge Orders    None         Macarthur Critchley, MD 08/10/17 2314

## 2017-08-11 ENCOUNTER — Encounter (HOSPITAL_COMMUNITY): Payer: Self-pay

## 2017-08-11 ENCOUNTER — Inpatient Hospital Stay (HOSPITAL_COMMUNITY): Payer: PPO

## 2017-08-11 LAB — COMPREHENSIVE METABOLIC PANEL
ALT: 35 U/L (ref 17–63)
ANION GAP: 11 (ref 5–15)
AST: 54 U/L — AB (ref 15–41)
Albumin: 3.5 g/dL (ref 3.5–5.0)
Alkaline Phosphatase: 39 U/L (ref 38–126)
BILIRUBIN TOTAL: 0.8 mg/dL (ref 0.3–1.2)
BUN: 29 mg/dL — AB (ref 6–20)
CALCIUM: 8.5 mg/dL — AB (ref 8.9–10.3)
CO2: 22 mmol/L (ref 22–32)
Chloride: 105 mmol/L (ref 101–111)
Creatinine, Ser: 1.29 mg/dL — ABNORMAL HIGH (ref 0.61–1.24)
GFR calc Af Amer: >60 mL/min (ref >60)
GFR, EST NON AFRICAN AMERICAN: 53 mL/min — AB (ref >60)
Glucose, Bld: 153 mg/dL — ABNORMAL HIGH (ref 65–99)
POTASSIUM: 4.2 mmol/L (ref 3.5–5.1)
Sodium: 138 mmol/L (ref 135–145)
TOTAL PROTEIN: 6.7 g/dL (ref 6.5–8.1)

## 2017-08-11 LAB — CBC
HEMATOCRIT: 36.4 % — AB (ref 39.0–52.0)
Hemoglobin: 11.7 g/dL — ABNORMAL LOW (ref 13.0–17.0)
MCH: 28.4 pg (ref 26.0–34.0)
MCHC: 32.1 g/dL (ref 30.0–36.0)
MCV: 88.3 fL (ref 78.0–100.0)
Platelets: 154 10*3/uL (ref 150–400)
RBC: 4.12 MIL/uL — ABNORMAL LOW (ref 4.22–5.81)
RDW: 13.4 % (ref 11.5–15.5)
WBC: 12.7 10*3/uL — ABNORMAL HIGH (ref 4.0–10.5)

## 2017-08-11 LAB — GLUCOSE, CAPILLARY
GLUCOSE-CAPILLARY: 179 mg/dL — AB (ref 65–99)
Glucose-Capillary: 128 mg/dL — ABNORMAL HIGH (ref 65–99)
Glucose-Capillary: 145 mg/dL — ABNORMAL HIGH (ref 65–99)
Glucose-Capillary: 253 mg/dL — ABNORMAL HIGH (ref 65–99)
Glucose-Capillary: 199 mg/dL — ABNORMAL HIGH (ref 65–99)

## 2017-08-11 MED ORDER — TRAMADOL HCL 50 MG PO TABS
50.0000 mg | ORAL_TABLET | Freq: Four times a day (QID) | ORAL | Status: DC | PRN
  Administered 2017-08-11: 50 mg via ORAL
  Filled 2017-08-11 (×2): qty 1

## 2017-08-11 MED ORDER — LISINOPRIL 20 MG PO TABS
20.0000 mg | ORAL_TABLET | Freq: Every day | ORAL | Status: DC
Start: 2017-08-11 — End: 2017-08-12
  Administered 2017-08-11 – 2017-08-12 (×2): 20 mg via ORAL
  Filled 2017-08-11 (×2): qty 1

## 2017-08-11 MED ORDER — SIMVASTATIN 40 MG PO TABS
40.0000 mg | ORAL_TABLET | Freq: Every day | ORAL | Status: DC
  Administered 2017-08-11: 40 mg via ORAL
  Filled 2017-08-11: qty 1

## 2017-08-11 MED ORDER — ACETAMINOPHEN 500 MG PO TABS
1000.0000 mg | ORAL_TABLET | Freq: Three times a day (TID) | ORAL | Status: DC
  Administered 2017-08-11 – 2017-08-12 (×4): 1000 mg via ORAL
  Filled 2017-08-11 (×4): qty 2

## 2017-08-11 MED ORDER — IBUPROFEN 400 MG PO TABS: 400.0000 mg | ORAL_TABLET | Freq: Four times a day (QID) | ORAL | Status: DC | PRN

## 2017-08-11 NOTE — Evaluation (Signed)
Physical Therapy Evaluation Patient Details Name: Gerald Ray MRN: [id number] DOB: [birth year] Today's Date: 08/11/2017   History of Present Illness  Pt is a 71 y/o male admitted after sustaining a fall from a deer stand secondary to having a syncopal episode. Pt found to have right 6 through 10 posterior rib fractures, right 5 through 6 lateral rib fractures, small right hemopneumothorax, T10 superior endplate compression fracture, T7 vertebral body compression fracture. PMH including but not limited to DM, HTN and renal disorder.  Clinical Impression  Pt presented supine in bed with HOB elevated, awake and willing to participate in therapy session. Pt's daughter-in-law present throughout session. Prior to admission, pt was very active and independent. Pt lives alone but has family that can provide 24/7 supervision/assistance if needed. Pt performed bed mobility with min guard and cueing, transfers with min guard and ambulated a short distance in his room with min guard for safety without use of an AD. Pt limited secondary to nausea with mobility. PT will continue to follow acutely to progress mobility as tolerated and to ensure a safe d/c home.     Follow Up Recommendations Supervision/Assistance - 24 hour;Other (comment)(initially)    Equipment Recommendations  None recommended by PT;Other (comment)(pt refusing 3-in-1)    Recommendations for Other Services       Precautions / Restrictions Precautions Precautions: Fall;Back Precaution Comments: compression fxs of thoracic spine Restrictions Weight Bearing Restrictions: No      Mobility  Bed Mobility Overal bed mobility: Needs Assistance Bed Mobility: Rolling;Sidelying to Sit Rolling: Min guard Sidelying to sit: Min guard       General bed mobility comments: increased time and effort, HOB elevated at request of pt, cueing for technique  Transfers Overall transfer level: Needs assistance Equipment used: 1 person hand held  assist Transfers: Sit to/from [facility] Sit to Stand: Min guard Stand pivot transfers: Min guard       General transfer comment: increased time and effort, min guard for safety  Ambulation/Gait Ambulation/Gait assistance: Min guard Ambulation Distance (Feet): 15 Feet Assistive device: None;1 person hand held assist Gait Pattern/deviations: Step-through pattern;Decreased step length - right;Decreased step length - left;Decreased stride length Gait velocity: decreased Gait velocity interpretation: Below normal speed for age/gender General Gait Details: pt limited secondary to nausea; pt taking short steps, very cautious, guarded gait  Stairs            Wheelchair Mobility    Modified Rankin (Stroke Patients Only)       Balance Overall balance assessment: Needs assistance Sitting-balance support: Feet supported Sitting balance-Leahy Scale: Good     Standing balance support: During functional activity;No upper extremity supported Standing balance-Leahy Scale: Fair                               Pertinent Vitals/Pain Pain Assessment: 0-10 Pain Score: 3  Pain Location: ribs Pain Descriptors / Indicators: Sore Pain Intervention(s): Monitored during session;Repositioned    Home Living Family/patient expects to be discharged to:: Private residence Living Arrangements: Alone Available Help at Discharge: Family;Friend(s);Available 24 hours/day Type of Home: House Home Access: Stairs to enter Entrance Stairs-Rails: Psychiatric nurse of Steps: 3 Home Layout: One level Home Equipment: None      Prior Function Level of Independence: Independent               Hand Dominance   Dominant Hand: Right    Extremity/Trunk Assessment  Upper Extremity Assessment Upper Extremity Assessment: Defer to OT evaluation    Lower Extremity Assessment Lower Extremity Assessment: Overall WFL for tasks assessed    Cervical  / Trunk Assessment Cervical / Trunk Assessment: Other exceptions Cervical / Trunk Exceptions: acute compression fxs of thoracic spine and several rib fxs  Communication   Communication: No difficulties  Cognition Arousal/Alertness: Awake/alert Behavior During Therapy: WFL for tasks assessed/performed Overall Cognitive Status: Within Functional Limits for tasks assessed                                        General Comments      Exercises     Assessment/Plan    PT Assessment Patient needs continued PT services  PT Problem List Decreased activity tolerance;Decreased balance;Decreased mobility;Decreased coordination;Decreased knowledge of precautions;Pain       PT Treatment Interventions DME instruction;Gait training;Stair training;Functional mobility training;Therapeutic activities;Therapeutic exercise;Neuromuscular re-education;Balance training;Patient/family education    PT Goals (Current goals can be found in the Care Plan section)  Acute Rehab PT Goals Patient Stated Goal: return home PT Goal Formulation: With patient/family Time For Goal Achievement: 08/25/17 Potential to Achieve Goals: Good    Frequency Min 5X/week   Barriers to discharge        Co-evaluation               AM-PAC PT "6 Clicks" Daily Activity  Outcome Measure Difficulty turning over in bed (including adjusting bedclothes, sheets and blankets)?: A Lot Difficulty moving from lying on back to sitting on the side of the bed? : A Lot Difficulty sitting down on and standing up from a chair with arms (e.g., wheelchair, bedside commode, etc,.)?: A Lot Help needed moving to and from a bed to chair (including a wheelchair)?: A Little Help needed walking in hospital room?: A Little Help needed climbing 3-5 steps with a railing? : A Little 6 Click Score: 15    End of Session   Activity Tolerance: Patient limited by pain;Other (comment)(pt limited by nausea) Patient left: in  chair;with call bell/phone within reach;with family/visitor present Nurse Communication: Mobility status PT Visit Diagnosis: Other abnormalities of gait and mobility (R26.89);Pain Pain - part of body: (back, ribs)    Time: 9233-0076 PT Time Calculation (min) (ACUTE ONLY): 24 min   Charges:   PT Evaluation $PT Eval Moderate Complexity: 1 Mod PT Treatments $Therapeutic Activity: 8-22 mins   PT G Codes:        [place], PT, DPT [place] 08/11/2017, 10:02 AM

## 2017-08-11 NOTE — Progress Notes (Signed)
[facility] Progress Note     Subjective: CC: nausea Patient states he is having some nausea this AM. Nausea only with movement and he reports in the past he has gotten nauseated from taking oxycodone. Patient states that pain is well controlled, currently rates pain as a 2-3/10. Tolerating clears. Denies abdominal pain, chest pain, SOB. Did note pain in back with getting up to the chair.  Discussed fall with patient who notes he remembers being tired and leaning forward in deer blind and then does not remember anything else. Does report that he has noticed he gets dizzy with taking his metformin at home. Checks his blood sugars in the AM and they are ok, but does not check them throughout the day. UOP good. VSS.   Objective: Vital signs in last 24 hours: Temp:  [97.1 F (36.2 C)-98.8 F (37.1 C)] 98.4 F (36.9 C) (12/27 0823) Pulse Rate:  [41-101] 64 (12/27 0823) Resp:  [13-26] 20 (12/27 0823) BP: (108-153)/(51-100) 131/51 (12/27 0823) SpO2:  [88 %-100 %] 100 % (12/27 0823) Weight:  [80.5 kg (177 lb 7.5 oz)-80.7 kg (178 lb)] 80.5 kg (177 lb 7.5 oz) (12/27 0050)    Intake/Output from previous day: 12/26 0701 - 12/27 0700 In: 6283 [P.O.:240; I.V.:1335] Out: 400 [Urine:400] Intake/Output this shift: Total I/O In: -  Out: 450 [Urine:450]  PE: Gen:  Alert, NAD, pleasant Card:  Regular rate and rhythm, pedal pulses 2+ BL Pulm:  Normal effort, clear to auscultation bilaterally, pulling 1000 on IS Abd: Soft, non-tender, non-distended, bowel sounds present, no HSM Skin: warm and dry, no rashes  Ext: ROM grossly intact in BL upper and lower extremities, strength 5/5 in BL upper and lower extremities Psych: A&Ox3   Lab Results:  Recent Labs    08/10/17 1620 08/10/17 1631 08/11/17 0828  WBC 14.7*  --  12.7*  HGB 13.0 13.9 11.7*  HCT 39.4 41.0 36.4*  PLT 178  --  154   BMET Recent Labs    08/10/17 1620 08/10/17 1631 08/11/17 0828  NA 137 139 138  K 4.8 4.7  4.2  CL 105 102 105  CO2 23  --  22  GLUCOSE 231* 226* 153*  BUN 25* 28* 29*  CREATININE 1.60* 1.50* 1.29*  CALCIUM 9.1  --  8.5*   PT/INR Recent Labs    08/10/17 1620  LABPROT 12.5  INR 0.94   CMP     Component Value Date/Time   NA 138 08/11/2017 0828   K 4.2 08/11/2017 0828   CL 105 08/11/2017 0828   CO2 22 08/11/2017 0828   GLUCOSE 153 (H) 08/11/2017 0828   BUN 29 (H) 08/11/2017 0828   CREATININE 1.29 (H) 08/11/2017 0828   CALCIUM 8.5 (L) 08/11/2017 0828   PROT 6.7 08/11/2017 0828   ALBUMIN 3.5 08/11/2017 0828   AST 54 (H) 08/11/2017 0828   ALT 35 08/11/2017 0828   ALKPHOS 39 08/11/2017 0828   BILITOT 0.8 08/11/2017 0828   GFRNONAA 53 (L) 08/11/2017 0828   GFRAA >60 08/11/2017 0828   Lipase  No results found for: LIPASE     Studies/Results: Dg Chest 2 View  Result Date: 08/11/2017 CLINICAL DATA:  Followup traumatic right fractures and pneumothorax. EXAM: CHEST  2 VIEW COMPARISON:  08/10/2017 FINDINGS: Mild atelectasis again seen in lung bases. No pneumothorax visualized. No evidence pulmonary airspace disease or pleural effusion. Heart size is stable. Several right rib fractures are again seen. Moderate size hiatal hernia also noted. IMPRESSION: Mild  bibasilar atelectasis.  No pneumothorax visualized. Right rib fractures and hiatal hernia. Electronically Signed   By: Earle Gell M.D.   On: 08/11/2017 08:12   Ct Head Wo Contrast  Result Date: 08/10/2017 CLINICAL DATA:  Dizzy, fall EXAM: CT HEAD WITHOUT CONTRAST CT CERVICAL SPINE WITHOUT CONTRAST TECHNIQUE: Multidetector CT imaging of the head and cervical spine was performed following the standard protocol without intravenous contrast. Multiplanar CT image reconstructions of the cervical spine were also generated. COMPARISON:  None. FINDINGS: CT HEAD FINDINGS Brain: No acute territorial infarction, hemorrhage or intracranial mass is visualized. Mild atrophy. Nonenlarged ventricles. Vascular: No hyperdense vessels.  Carotid artery calcification. Vertebral artery calcification. Skull: No depressed skull fracture.  Mastoid air cells are clear. Sinuses/Orbits: No acute finding. Other: Soft tissue emphysema in the right posterior neck and around the right carotid vessels. CT CERVICAL SPINE FINDINGS Alignment: Trace anterolisthesis of C4 on C5. Facet alignment is within normal limits. Skull base and vertebrae: No acute fracture. No primary bone lesion or focal pathologic process. Soft tissues and spinal canal: No prevertebral fluid or swelling. No visible canal hematoma. Disc levels: Marked degenerative changes at C6-C7. Mild degenerative changes at C3-C4 and C4-C5. Upper chest: Moderate soft tissue emphysema in the right neck and paraspinal region. Probable tiny focus of pneumothorax anteriorly at right apex but incompletely visualized. Other: None IMPRESSION: 1. No CT evidence for acute intracranial abnormality. 2. Trace anterolisthesis of C4 on C5, possibly degenerative. No fracture seen 3. Moderate soft tissue emphysema in the right neck and paraspinal region. Probable tiny right anterior apical pneumothorax but incompletely visualized. See dedicated chest CT. Electronically Signed   By: Donavan Foil M.D.   On: 08/10/2017 18:12   Ct Chest W Contrast  Result Date: 08/10/2017 CLINICAL DATA:  Fall from deer stand today, dizziness, loss of consciousness. Right-sided chest pain. EXAM: CT CHEST WITH CONTRAST TECHNIQUE: Multidetector CT imaging of the chest was performed during intravenous contrast administration. CONTRAST:  35mL ISOVUE-300 IOPAMIDOL (ISOVUE-300) INJECTION 61% COMPARISON:  None. FINDINGS: Cardiovascular: Thoracic aorta appears intact and normal in configuration. Heart size is normal. No significant pericardial effusion seen. Aortic atherosclerosis.  Coronary artery calcifications. Mediastinum/Nodes: No hemorrhage or edema appreciated within the mediastinum. No mass or enlarged lymph nodes seen within the  mediastinum or perihilar regions. Hiatal hernia, small to moderate in size. Trachea and central bronchi are unremarkable. Lungs/Pleura: Small right-sided pneumothorax, appreciated at both the lung base and lung apex. Small right pleural effusion, presumed hemothorax. Associated dependent atelectasis within the right lung. Additional mild atelectasis at the left lung base. Upper Abdomen: No acute solid organ abnormality seen. No free intraperitoneal air. Musculoskeletal: Mild compression fracture deformity involving the superior endplate of the P53 vertebral body. Additional compression fracture deformity of the T7 vertebral body. Slightly displaced fractures of the posterior right sixth through tenth ribs. Additional displaced fracture of the lateral right fifth and sixth ribs, also acute in appearance. Additional fracture deformities within the right posterior fifth rib appears old. IMPRESSION: 1. Small right-sided pneumothorax. Associated small right-sided hemothorax and atelectasis. 2. Slightly displaced fractures of the posterior right sixth through tenth ribs. Additional displaced fractures of the lateral right fifth and sixth ribs. 3. Acute mild compression fracture deformity involving the superior endplate of the Z48 vertebral body. Additional compression fracture deformity of the T7 vertebral body, also acute. Both compression fractures with approximately 10-20% loss of vertebral body height. No associated vertebral body displacement or retropulsion. Both fractures involves the anterior and middle columns. No extension  to the posterior elements at either level. 4. Aortic atherosclerosis.  No evidence of aortic injury. Critical Value/emergent results were called by telephone at the time of interpretation on 08/10/2017 at 6:15 pm to Dr. Zenovia Jarred , who verbally acknowledged these results. Electronically Signed   By: Franki Cabot M.D.   On: 08/10/2017 18:19   Ct Cervical Spine Wo Contrast  Result  Date: 08/10/2017 CLINICAL DATA:  Dizzy, fall EXAM: CT HEAD WITHOUT CONTRAST CT CERVICAL SPINE WITHOUT CONTRAST TECHNIQUE: Multidetector CT imaging of the head and cervical spine was performed following the standard protocol without intravenous contrast. Multiplanar CT image reconstructions of the cervical spine were also generated. COMPARISON:  None. FINDINGS: CT HEAD FINDINGS Brain: No acute territorial infarction, hemorrhage or intracranial mass is visualized. Mild atrophy. Nonenlarged ventricles. Vascular: No hyperdense vessels. Carotid artery calcification. Vertebral artery calcification. Skull: No depressed skull fracture.  Mastoid air cells are clear. Sinuses/Orbits: No acute finding. Other: Soft tissue emphysema in the right posterior neck and around the right carotid vessels. CT CERVICAL SPINE FINDINGS Alignment: Trace anterolisthesis of C4 on C5. Facet alignment is within normal limits. Skull base and vertebrae: No acute fracture. No primary bone lesion or focal pathologic process. Soft tissues and spinal canal: No prevertebral fluid or swelling. No visible canal hematoma. Disc levels: Marked degenerative changes at C6-C7. Mild degenerative changes at C3-C4 and C4-C5. Upper chest: Moderate soft tissue emphysema in the right neck and paraspinal region. Probable tiny focus of pneumothorax anteriorly at right apex but incompletely visualized. Other: None IMPRESSION: 1. No CT evidence for acute intracranial abnormality. 2. Trace anterolisthesis of C4 on C5, possibly degenerative. No fracture seen 3. Moderate soft tissue emphysema in the right neck and paraspinal region. Probable tiny right anterior apical pneumothorax but incompletely visualized. See dedicated chest CT. Electronically Signed   By: Donavan Foil M.D.   On: 08/10/2017 18:12   Dg Chest Port 1 View  Result Date: 08/10/2017 CLINICAL DATA:  Patient fell 10-15 feet from a tree stand. The patient is complaining of difficulty taking a deep breath.  EXAM: PORTABLE CHEST 1 VIEW COMPARISON:  None in PACs FINDINGS: The lungs are borderline hypoinflated. The interstitial markings are coarse especially at the left base. There is no pneumothorax or pneumomediastinum or significant pleural effusion. The cardiac silhouette is top-normal in size. The pulmonary vascularity is normal. There is calcification in the wall of the aortic arch. The observed portions of the ribs appear intact. There is surgical suture material projecting over the midshaft of the left clavicle but no acute clavicular fracture is observed. The observed portions of the shoulders are normal. IMPRESSION: Probable bibasilar atelectasis. No pneumothorax or evidence of pulmonary contusion. If there are clinical concerns of significant occult intrathoracic injury, chest CT scanning is recommended. Thoracic aortic atherosclerosis the Electronically Signed   By: David  Martinique M.D.   On: 08/10/2017 16:33    Anti-infectives: Anti-infectives (From admission, onward)   None       Assessment/Plan Fall from deer stand Right 6-10 posterior rib fractures, right 5-6 lateral rib fractures, small HPTX - CXR this AM with no PTX - IS, pulm toilet, pain control T10 superior endplate compression fracture, T7 vertebral body fracture - non-op management per Dr. Saintclair Halsted, mobilize patient and brace if pain - TLSO brace ordered Diabetes - SSI HTN - restart home lisinopril Syncopal episode - outpatient workup per PCP  FEN: advance to CM diet; d/c oxycodone, scheduled tylenol, prn ibuprofen, prn robaxin, prn tramadol, dilaudid for breakthrough  VTE: SCDs, lovenox ID: no current abx  Dispo: Therapies. TLSO. Pain control. Possibly home tomorrow if doing well.   LOS: 1 day    Brigid Re , [facility] Surgery 08/11/2017, 9:57 AM Pager: [phone] Trauma Pager: [phone] Mon-Fri 7:00 am-4:30 pm Sat-Sun 7:00 am-11:30 am

## 2017-08-11 NOTE — Evaluation (Signed)
Occupational Therapy Evaluation and Discharge Patient Details Name: Gerald Ray MRN: [id number] DOB: 1945-11-21 Today's Date: 08/11/2017    History of Present Illness Pt is a 71 y/o male admitted after sustaining a fall from a deer stand secondary to having a syncopal episode. Pt found to have right 6 through 10 posterior rib fractures, right 5 through 6 lateral rib fractures, small right hemopneumothorax, T10 superior endplate compression fracture, T7 vertebral body compression fracture. PMH including but not limited to DM, HTN and renal disorder.   Clinical Impression   This 71 yo male admitted with above presents to acute OT with all OT education completed and back handout given. No further OT needs, we will D/C from acute OT.    Follow Up Recommendations  No OT follow up;Supervision - Intermittent    Equipment Recommendations  None recommended by OT       Precautions / Restrictions Precautions Precautions: Fall;Back Precaution Comments: compression fxs of thoracic spine Required Braces or Orthoses: Spinal Brace Spinal Brace: Thoracolumbosacral orthotic;Applied in standing position(to help with pain) Restrictions Weight Bearing Restrictions: No      Mobility Bed Mobility               General bed mobility comments: Pt up in recliner upon arrival  Transfers Overall transfer level: Needs assistance Equipment used: None Transfers: Sit to/from Stand Sit to Stand: Supervision         General transfer comment: Pt ambulated 100 feet with S without AD    Balance Overall balance assessment: Needs assistance Sitting-balance support: Feet supported;No upper extremity supported Sitting balance-Leahy Scale: Good     Standing balance support: No upper extremity supported Standing balance-Leahy Scale: Fair                             ADL either performed or assessed with clinical judgement   ADL                                          General ADL Comments: Pt can cross one leg over the other to get to his feet for socks and shoes, educated on easiest sequence of getting LBD and sequence in general for dressing to help with pain control, pt doffed and donned his TLSO sitting EOB with min A. Educated on use of two cups for brushing teeth so he does not have to bend over the sink as well as recommendation for use of wet wipes for back peri care     Vision Patient Visual Report: No change from baseline              Pertinent Vitals/Pain Pain Assessment: 0-10 Pain Score: 3  Pain Location: ribs Pain Descriptors / Indicators: Sore Pain Intervention(s): Monitored during session;Repositioned     Hand Dominance Right   Extremity/Trunk Assessment Upper Extremity Assessment Upper Extremity Assessment: RUE deficits/detail RUE Deficits / Details: some limitations of movement due to pain from right rib fractures RUE Coordination: decreased gross motor           Communication Communication Communication: No difficulties   Cognition Arousal/Alertness: Awake/alert Behavior During Therapy: WFL for tasks assessed/performed Overall Cognitive Status: Within Functional Limits for tasks assessed  Home Living Family/patient expects to be discharged to:: Private residence Living Arrangements: Alone Available Help at Discharge: Family;Friend(s);Available 24 hours/day Type of Home: House Home Access: Stairs to enter [employer] of Steps: 3 Entrance Stairs-Rails: Right;Left Home Layout: One level     Bathroom Shower/Tub: Occupational psychologist: Handicapped height     Home Equipment: None          Prior Functioning/Environment Level of Independence: Independent                 OT Problem List: Decreased range of motion;Pain         OT Goals(Current goals can be found in the care plan section) Acute Rehab OT  Goals Patient Stated Goal: return home  OT Frequency:                AM-PAC PT "6 Clicks" Daily Activity     Outcome Measure Help from another person eating meals?: None Help from another person taking care of personal grooming?: None Help from another person toileting, which includes using toliet, bedpan, or urinal?: None Help from another person bathing (including washing, rinsing, drying)?: None Help from another person to put on and taking off regular upper body clothing?: None Help from another person to put on and taking off regular lower body clothing?: None 6 Click Score: 24   End of Session Equipment Utilized During Treatment: Rolling walker;Gait belt;Back brace Nurse Communication: (pt requesting ibuprofen)  Activity Tolerance: Patient tolerated treatment well Patient left: in chair;with call bell/phone within reach;with family/visitor present  OT Visit Diagnosis: Pain Pain - part of body: (right ribs)                Time: 7591-6384 OT Time Calculation (min): 22 min Charges:  OT General Charges $OT Visit: 1 Visit OT Evaluation $OT Eval Moderate Complexity: [address] [place], [place] [phone] 08/11/2017

## 2017-08-11 NOTE — Care Management Note (Signed)
Case Management Note  Patient Details  Name: KIAAN OVERHOLSER MRN: [id number] Date of Birth: 25-Mar-1946  Subjective/Objective:    Pt is a 71 y/o male admitted after sustaining a fall from a deer stand secondary to having a syncopal episode. Pt found to have right 6 through 10 posterior rib fractures, right 5 through 6 lateral rib fractures, small right hemopneumothorax, T10 superior endplate compression fracture, T7 vertebral body compression fracture.  PTA, pt independent, lives alone.                  Action/Plan: PT/OT recommending supervision at discharge; no OP follow up.  Will follow as pt progresses.    Expected Discharge Date:                  Expected Discharge Plan:  Home/Self Care  In-House Referral:     Discharge planning Services  CM Consult  Post Acute Care Choice:    Choice offered to:     DME Arranged:    DME Agency:     HH Arranged:    HH Agency:     Status of Service:  In process, will continue to follow  If discussed at Long Length of Stay Meetings, dates discussed:    Additional Comments:  Reinaldo Raddle, RN, BSN  Trauma/Neuro ICU Case Manager [phone]

## 2017-08-11 NOTE — ED Provider Notes (Signed)
ED ECG REPORT   Date: 08/11/2017  Rate: 95  Rhythm: normal sinus rhythm  QRS Axis: left  Intervals: normal  ST/T Wave abnormalities: normal  Conduction Disutrbances:none  Narrative Interpretation:   Old EKG Reviewed: none available  I have personally reviewed the EKG tracing and agree with the computerized printout as noted. I interpreted the EKG only.  I did not participate in patient's care   Orlie Dakin, MD 08/11/17 [phone]

## 2017-08-11 NOTE — Progress Notes (Signed)
Orthopedic Tech Progress Note Patient Details:  Gerald Ray Nov 08, 1945 [id number]  Patient ID: Gerald Ray, male   DOB: September 23, 1945, 71 y.o.   MRN: [id number]   Gerald Ray 08/11/2017, 10:40 AM Called in bio-tech brace order; spoke with [name]

## 2017-08-11 NOTE — Progress Notes (Signed)
OT Cancellation Note  Patient Details Name: Gerald Ray MRN: [id number] DOB: 10/21/1945   Cancelled Treatment:    Reason Eval/Treat Not Completed: Other (comment). Pt currently eating his lunch.  Almon Register [phone] 08/11/2017, 1:18 PM

## 2017-08-12 LAB — GLUCOSE, CAPILLARY
GLUCOSE-CAPILLARY: 172 mg/dL — AB (ref 65–99)
GLUCOSE-CAPILLARY: 188 mg/dL — AB (ref 65–99)

## 2017-08-12 MED ORDER — METHOCARBAMOL 500 MG PO TABS: 500.0000 mg | ORAL_TABLET | Freq: Four times a day (QID) | ORAL | 1 refills | Status: DC

## 2017-08-12 MED ORDER — TRAMADOL HCL 50 MG PO TABS: 50.0000 mg | ORAL_TABLET | Freq: Four times a day (QID) | ORAL | 1 refills | Status: DC | PRN

## 2017-08-12 NOTE — Progress Notes (Signed)
CSW met with pt and completed SBIRT- pt reports that he does not drink or use drugs ever so no further follow up needed for substance abuse resources  Pt feeling good and glad he can mobilize well today- hopeful he can discharge soon  No further CSW needs at this time- signing off  Jorge Ny, [place] Worker [phone]

## 2017-08-12 NOTE — Discharge Instructions (Signed)
Rib Fracture A rib fracture is a break or crack in one of the bones of the ribs. The ribs are like a cage that goes around your upper chest. A broken or cracked rib is often painful, but most do not cause other problems. Most rib fractures heal on their own in 1-3 months. Follow these instructions at home:  Avoid activities that cause pain to the injured area. Protect your injured area.  Slowly increase activity as told by your doctor.  Take medicine as told by your doctor.  Put ice on the injured area for the first 1-2 days after you have been treated or as told by your doctor. ? Put ice in a plastic bag. ? Place a towel between your skin and the bag. ? Leave the ice on for 15-20 minutes at a time, every 2 hours while you are awake.  Do deep breathing as told by your doctor. You may be told to: ? Take deep breaths many times a day. ? Cough many times a day while hugging a pillow. ? Use a device (incentive spirometer) to perform deep breathing many times a day.  Drink enough fluids to keep your pee (urine) clear or pale yellow.  Do not wear a rib belt or binder. These do not allow you to breathe deeply. Get help right away if:  You have a fever.  You have trouble breathing.  You cannot stop coughing.  You cough up thick or bloody spit (mucus).  You feel sick to your stomach (nauseous), throw up (vomit), or have belly (abdominal) pain.  Your pain gets worse and medicine does not help. This information is not intended to replace advice given to you by your health care provider. Make sure you discuss any questions you have with your health care provider. Document Released: 05/11/2008 Document Revised: 01/08/2016 Document Reviewed: 10/04/2012 Elsevier Interactive Patient Education  2018 [place] brace for your back as needed for pain with ambulation.   1. PAIN CONTROL:  1. Pain is best controlled by a usual combination of three different methods TOGETHER:   1. Ice/Heat 2. Over the counter pain medication 3. Prescription pain medication 2. Most patients will experience some swelling and bruising around wounds. Ice packs or heating pads (30-60 minutes up to 6 times a day) will help. Use ice for the first few days to help decrease swelling and bruising, then switch to heat to help relax tight/sore spots and speed recovery. Some people prefer to use ice alone, heat alone, alternating between ice & heat. Experiment to what works for you. Swelling and bruising can take several weeks to resolve.  3. It is helpful to take an over-the-counter pain medication regularly for the first few weeks. Choose one of the following that works best for you:  1. Naproxen (Aleve, etc) Two 220mg  tabs twice a day 2. Ibuprofen (Advil, etc) Three 200mg  tabs four times a day (every meal & bedtime) 3. Acetaminophen (Tylenol, etc) 500-650mg  four times a day (every meal & bedtime) 4. A prescription for pain medication (such as oxycodone, hydrocodone, etc) should be given to you upon discharge. Take your pain medication as prescribed.  1. If you are having problems/concerns with the prescription medicine (does not control pain, nausea, vomiting, rash, itching, etc), please call us [phone] to see if we need to switch you to a different pain medicine that will work better for you and/or control your side effect better. 2. If you need a refill  on your pain medication, please contact your pharmacy. They will contact our office to request authorization. Prescriptions will not be filled after 5 pm or on week-ends. 4. Avoid getting constipated. When taking pain medications, it is common to experience some constipation. Increasing fluid intake and taking a fiber supplement (such as Metamucil, Citrucel, FiberCon, MiraLax, etc) 1-2 times a day regularly will usually help prevent this problem from occurring. A mild laxative (prune juice, Milk of Magnesia, MiraLax, etc) should be taken  according to package directions if there are no bowel movements after 48 hours.  5. Watch out for diarrhea. If you have many loose bowel movements, simplify your diet to bland foods & liquids for a few days. Stop any stool softeners and decrease your fiber supplement. Switching to mild anti-diarrheal medications (Kayopectate, Pepto Bismol) can help. If this worsens or does not improve, please call us. 6. Wash / shower every day. You may shower daily and replace your bandges after showering. No bathing or submerging your wounds in water until they heal. 7. FOLLOW UP in our office  1. Please call [facility] at [phone] to set up an appointment for a follow-up appointment approximately 2-3 weeks after discharge for wound check  WHEN TO CALL us [phone]:  1. Poor pain control 2. Reactions / problems with new medications (rash/itching, nausea, etc)  3. Fever over 101.5 F (38.5 C) 4. Worsening swelling or bruising 5. Continued bleeding from wounds. 6. Increased pain, redness, or drainage from the wounds which could be signs of infection  The clinic staff is available to answer your questions during regular business hours (8:30am-5pm). Please dont hesitate to call and ask to speak to one of our nurses for clinical concerns.  If you have a medical emergency, go to the nearest emergency room or call 911.  A surgeon from [facility] is always on call at the [facility], [address], [place], [place], [place] [address] ?  MAIN: [phone] ? TOLL FREE: [phone] ?  FAX [phone]  [email or web address]

## 2017-08-12 NOTE — Progress Notes (Signed)
Patient discharged to home with family. IV x 2 removed. Discharge instructions reviewed. Medications and followup appts reviewed. Patients belongings with the patient. Patient left unit in stable condition.  Sheliah Plane RN

## 2017-08-12 NOTE — Progress Notes (Signed)
Trauma Service Note  Subjective: Patient doing okay.  Upa and down the hallways with therapy.  Minimal discomfort or pain.  Objective: Vital signs in last 24 hours: Temp:  [98.3 F (36.8 C)-98.8 F (37.1 C)] 98.6 F (37 C) (12/28 0912) Pulse Rate:  [60-68] 68 (12/28 0912) Resp:  [18-20] 18 (12/28 0912) BP: (94-136)/(49-61) 94/49 (12/28 0912) SpO2:  [93 %-96 %] 93 % (12/28 0912) Weight:  [80.6 kg (177 lb 11.1 oz)] 80.6 kg (177 lb 11.1 oz) (12/27 2016) Last BM Date: 08/10/17  Intake/Output from previous day: 12/27 0701 - 12/28 0700 In: 2790 [P.O.:840; I.V.:1950] Out: 450 [Urine:450] Intake/Output this shift: Total I/O In: 240 [P.O.:240] Out: 0   General: No acute distress.  Lungs: Clear to auscultatiion.  Abd: Benign  Extremities: Intact  Neuro: Intact  Lab Results: CBC  Recent Labs    08/10/17 1620 08/10/17 1631 08/11/17 0828  WBC 14.7*  --  12.7*  HGB 13.0 13.9 11.7*  HCT 39.4 41.0 36.4*  PLT 178  --  154   BMET Recent Labs    08/10/17 1620 08/10/17 1631 08/11/17 0828  NA 137 139 138  K 4.8 4.7 4.2  CL 105 102 105  CO2 23  --  22  GLUCOSE 231* 226* 153*  BUN 25* 28* 29*  CREATININE 1.60* 1.50* 1.29*  CALCIUM 9.1  --  8.5*   PT/INR Recent Labs    08/10/17 1620  LABPROT 12.5  INR 0.94   ABG No results for input(s): PHART, HCO3 in the last 72 hours.  Invalid input(s): PCO2, PO2  Studies/Results: Dg Chest 2 View  Result Date: 08/11/2017 CLINICAL DATA:  Followup traumatic right fractures and pneumothorax. EXAM: CHEST  2 VIEW COMPARISON:  08/10/2017 FINDINGS: Mild atelectasis again seen in lung bases. No pneumothorax visualized. No evidence pulmonary airspace disease or pleural effusion. Heart size is stable. Several right rib fractures are again seen. Moderate size hiatal hernia also noted. IMPRESSION: Mild bibasilar atelectasis.  No pneumothorax visualized. Right rib fractures and hiatal hernia. Electronically Signed   By: Earle Gell M.D.    On: 08/11/2017 08:12   Ct Head Wo Contrast  Result Date: 08/10/2017 CLINICAL DATA:  Dizzy, fall EXAM: CT HEAD WITHOUT CONTRAST CT CERVICAL SPINE WITHOUT CONTRAST TECHNIQUE: Multidetector CT imaging of the head and cervical spine was performed following the standard protocol without intravenous contrast. Multiplanar CT image reconstructions of the cervical spine were also generated. COMPARISON:  None. FINDINGS: CT HEAD FINDINGS Brain: No acute territorial infarction, hemorrhage or intracranial mass is visualized. Mild atrophy. Nonenlarged ventricles. Vascular: No hyperdense vessels. Carotid artery calcification. Vertebral artery calcification. Skull: No depressed skull fracture.  Mastoid air cells are clear. Sinuses/Orbits: No acute finding. Other: Soft tissue emphysema in the right posterior neck and around the right carotid vessels. CT CERVICAL SPINE FINDINGS Alignment: Trace anterolisthesis of C4 on C5. Facet alignment is within normal limits. Skull base and vertebrae: No acute fracture. No primary bone lesion or focal pathologic process. Soft tissues and spinal canal: No prevertebral fluid or swelling. No visible canal hematoma. Disc levels: Marked degenerative changes at C6-C7. Mild degenerative changes at C3-C4 and C4-C5. Upper chest: Moderate soft tissue emphysema in the right neck and paraspinal region. Probable tiny focus of pneumothorax anteriorly at right apex but incompletely visualized. Other: None IMPRESSION: 1. No CT evidence for acute intracranial abnormality. 2. Trace anterolisthesis of C4 on C5, possibly degenerative. No fracture seen 3. Moderate soft tissue emphysema in the right neck and paraspinal region.  Probable tiny right anterior apical pneumothorax but incompletely visualized. See dedicated chest CT. Electronically Signed   By: Donavan Foil M.D.   On: 08/10/2017 18:12   Ct Chest W Contrast  Result Date: 08/10/2017 CLINICAL DATA:  Fall from deer stand today, dizziness, loss of  consciousness. Right-sided chest pain. EXAM: CT CHEST WITH CONTRAST TECHNIQUE: Multidetector CT imaging of the chest was performed during intravenous contrast administration. CONTRAST:  87mL ISOVUE-300 IOPAMIDOL (ISOVUE-300) INJECTION 61% COMPARISON:  None. FINDINGS: Cardiovascular: Thoracic aorta appears intact and normal in configuration. Heart size is normal. No significant pericardial effusion seen. Aortic atherosclerosis.  Coronary artery calcifications. Mediastinum/Nodes: No hemorrhage or edema appreciated within the mediastinum. No mass or enlarged lymph nodes seen within the mediastinum or perihilar regions. Hiatal hernia, small to moderate in size. Trachea and central bronchi are unremarkable. Lungs/Pleura: Small right-sided pneumothorax, appreciated at both the lung base and lung apex. Small right pleural effusion, presumed hemothorax. Associated dependent atelectasis within the right lung. Additional mild atelectasis at the left lung base. Upper Abdomen: No acute solid organ abnormality seen. No free intraperitoneal air. Musculoskeletal: Mild compression fracture deformity involving the superior endplate of the U23 vertebral body. Additional compression fracture deformity of the T7 vertebral body. Slightly displaced fractures of the posterior right sixth through tenth ribs. Additional displaced fracture of the lateral right fifth and sixth ribs, also acute in appearance. Additional fracture deformities within the right posterior fifth rib appears old. IMPRESSION: 1. Small right-sided pneumothorax. Associated small right-sided hemothorax and atelectasis. 2. Slightly displaced fractures of the posterior right sixth through tenth ribs. Additional displaced fractures of the lateral right fifth and sixth ribs. 3. Acute mild compression fracture deformity involving the superior endplate of the N36 vertebral body. Additional compression fracture deformity of the T7 vertebral body, also acute. Both compression  fractures with approximately 10-20% loss of vertebral body height. No associated vertebral body displacement or retropulsion. Both fractures involves the anterior and middle columns. No extension to the posterior elements at either level. 4. Aortic atherosclerosis.  No evidence of aortic injury. Critical Value/emergent results were called by telephone at the time of interpretation on 08/10/2017 at 6:15 pm to Dr. Zenovia Jarred , who verbally acknowledged these results. Electronically Signed   By: Franki Cabot M.D.   On: 08/10/2017 18:19   Ct Cervical Spine Wo Contrast  Result Date: 08/10/2017 CLINICAL DATA:  Dizzy, fall EXAM: CT HEAD WITHOUT CONTRAST CT CERVICAL SPINE WITHOUT CONTRAST TECHNIQUE: Multidetector CT imaging of the head and cervical spine was performed following the standard protocol without intravenous contrast. Multiplanar CT image reconstructions of the cervical spine were also generated. COMPARISON:  None. FINDINGS: CT HEAD FINDINGS Brain: No acute territorial infarction, hemorrhage or intracranial mass is visualized. Mild atrophy. Nonenlarged ventricles. Vascular: No hyperdense vessels. Carotid artery calcification. Vertebral artery calcification. Skull: No depressed skull fracture.  Mastoid air cells are clear. Sinuses/Orbits: No acute finding. Other: Soft tissue emphysema in the right posterior neck and around the right carotid vessels. CT CERVICAL SPINE FINDINGS Alignment: Trace anterolisthesis of C4 on C5. Facet alignment is within normal limits. Skull base and vertebrae: No acute fracture. No primary bone lesion or focal pathologic process. Soft tissues and spinal canal: No prevertebral fluid or swelling. No visible canal hematoma. Disc levels: Marked degenerative changes at C6-C7. Mild degenerative changes at C3-C4 and C4-C5. Upper chest: Moderate soft tissue emphysema in the right neck and paraspinal region. Probable tiny focus of pneumothorax anteriorly at right apex but  incompletely visualized. Other: None  IMPRESSION: 1. No CT evidence for acute intracranial abnormality. 2. Trace anterolisthesis of C4 on C5, possibly degenerative. No fracture seen 3. Moderate soft tissue emphysema in the right neck and paraspinal region. Probable tiny right anterior apical pneumothorax but incompletely visualized. See dedicated chest CT. Electronically Signed   By: Donavan Foil M.D.   On: 08/10/2017 18:12   Dg Chest Port 1 View  Result Date: 08/10/2017 CLINICAL DATA:  Patient fell 10-15 feet from a tree stand. The patient is complaining of difficulty taking a deep breath. EXAM: PORTABLE CHEST 1 VIEW COMPARISON:  None in PACs FINDINGS: The lungs are borderline hypoinflated. The interstitial markings are coarse especially at the left base. There is no pneumothorax or pneumomediastinum or significant pleural effusion. The cardiac silhouette is top-normal in size. The pulmonary vascularity is normal. There is calcification in the wall of the aortic arch. The observed portions of the ribs appear intact. There is surgical suture material projecting over the midshaft of the left clavicle but no acute clavicular fracture is observed. The observed portions of the shoulders are normal. IMPRESSION: Probable bibasilar atelectasis. No pneumothorax or evidence of pulmonary contusion. If there are clinical concerns of significant occult intrathoracic injury, chest CT scanning is recommended. Thoracic aortic atherosclerosis the Electronically Signed   By: David  Martinique M.D.   On: 08/10/2017 16:33    Anti-infectives: Anti-infectives (From admission, onward)   None      Assessment/Plan: s/p  Discharge Fall from deer stand  Fall from deer stand Right 6-10 posterior rib fractures, right 5-6 lateral rib fractures, small HPTX - Breathing well T10 superior endplate compression fracture, T7 vertebral body fracture - non-op management per Dr. Saintclair Halsted,  Has mobilize with brace n place and doing well. -  TLSO brace ordered Diabetes - SSI HTN - restart home lisinopril Syncopal episode - outpatient workup per PCP  Okay to go home today.   LOS: 2 days   Kathryne Eriksson. Dahlia Bailiff, MD, FACS [phone] Trauma Surgeon 08/12/2017

## 2017-08-12 NOTE — Care Management Note (Signed)
Case Management Note  Patient Details  Name: Gerald Ray MRN: [id number] Date of Birth: [birth year]  Subjective/Objective:    Pt is a 71 y/o male admitted after sustaining a fall from a deer stand secondary to having a syncopal episode. Pt found to have right 6 through 10 posterior rib fractures, right 5 through 6 lateral rib fractures, small right hemopneumothorax, T10 superior endplate compression fracture, T7 vertebral body compression fracture.  PTA, pt independent, lives alone.                  Action/Plan: PT/OT recommending supervision at discharge; no OP follow up.  Will follow as pt progresses.    Expected Discharge Date:  08/12/17               Expected Discharge Plan:  Home/Self Care  In-House Referral:     Discharge planning Services  CM Consult  Post Acute Care Choice:    Choice offered to:     DME Arranged:    DME Agency:     HH Arranged:    HH Agency:     Status of Service:  Completed, signed off  If discussed at [employer] of Stay Meetings, dates discussed:    Additional Comments:  08/12/17 J. Violett Hobbs, RN, BSN Pt medically stable for dc home today, per MD.  No dc needs needs identified; friends available to assist at discharge.    Reinaldo Raddle, RN, BSN  Trauma/Neuro ICU Case Manager [phone]

## 2017-08-12 NOTE — Progress Notes (Signed)
Physical Therapy Treatment Patient Details Name: Gerald Ray MRN: [id number] DOB: 1946-04-22 Today's Date: 08/12/2017    History of Present Illness Pt is a 71 y/o male admitted after sustaining a fall from a deer stand secondary to having a syncopal episode. Pt found to have right 6 through 10 posterior rib fractures, right 5 through 6 lateral rib fractures, small right hemopneumothorax, T10 superior endplate compression fracture, T7 vertebral body compression fracture. PMH including but not limited to DM, HTN and renal disorder.    PT Comments    Pt making good progress with functional mobility and successfully completed stair training this session. PT will continue to follow acutely to ensure a safe d/c home.   Follow Up Recommendations  No PT follow up;Supervision for mobility/OOB     Equipment Recommendations  None recommended by PT    Recommendations for Other Services       Precautions / Restrictions Precautions Precautions: Fall;Back Precaution Comments: compression fxs of thoracic spine Required Braces or Orthoses: Spinal Brace Spinal Brace: Thoracolumbosacral orthotic;Applied in sitting position;Other (comment)(for pt comfort) Restrictions Weight Bearing Restrictions: No    Mobility  Bed Mobility Overal bed mobility: Needs Assistance Bed Mobility: Rolling;Sidelying to Sit;Sit to Sidelying Rolling: Supervision Sidelying to sit: Supervision     Sit to sidelying: Supervision General bed mobility comments: supervision for safety, max cueing to use log roll technique  Transfers Overall transfer level: Needs assistance Equipment used: None Transfers: Sit to/from Stand Sit to Stand: Supervision         General transfer comment: supervision for safety  Ambulation/Gait Ambulation/Gait assistance: Min guard Ambulation Distance (Feet): 150 Feet Assistive device: None Gait Pattern/deviations: Step-through pattern;Decreased step length - right;Decreased step  length - left;Decreased stride length;Drifts right/left Gait velocity: decreased Gait velocity interpretation: Below normal speed for age/gender General Gait Details: pt with mild instability without use of an AD. Pt with LOB x2 but able to self correct   Stairs Stairs: Yes   Stair Management: One rail Left;Alternating pattern;Step to pattern;Forwards Number of Stairs: 2 General stair comments: no instability or LOB, min guard for safety  Wheelchair Mobility    Modified Rankin (Stroke Patients Only)       Balance Overall balance assessment: Needs assistance Sitting-balance support: Feet supported;No upper extremity supported Sitting balance-Leahy Scale: Good     Standing balance support: No upper extremity supported Standing balance-Leahy Scale: Fair                              Cognition Arousal/Alertness: Awake/alert Behavior During Therapy: WFL for tasks assessed/performed Overall Cognitive Status: Within Functional Limits for tasks assessed                                        Exercises      General Comments        Pertinent Vitals/Pain Pain Assessment: No/denies pain    Home Living                      Prior Function            PT Goals (current goals can now be found in the care plan section) Acute Rehab PT Goals PT Goal Formulation: With patient/family Time For Goal Achievement: 08/25/17 Potential to Achieve Goals: Good Progress towards PT goals: Progressing toward goals    Frequency  Min 5X/week      PT Plan Current plan remains appropriate    Co-evaluation              AM-PAC PT "6 Clicks" Daily Activity  Outcome Measure  Difficulty turning over in bed (including adjusting bedclothes, sheets and blankets)?: A Little Difficulty moving from lying on back to sitting on the side of the bed? : A Little Difficulty sitting down on and standing up from a chair with arms (e.g., wheelchair,  bedside commode, etc,.)?: A Little Help needed moving to and from a bed to chair (including a wheelchair)?: None Help needed walking in hospital room?: A Little Help needed climbing 3-5 steps with a railing? : A Little 6 Click Score: 19    End of Session Equipment Utilized During Treatment: Gait belt;Back brace Activity Tolerance: Patient tolerated treatment well Patient left: in bed;with call bell/phone within reach Nurse Communication: Mobility status PT Visit Diagnosis: Other abnormalities of gait and mobility (R26.89)     Time: 6606-0045 PT Time Calculation (min) (ACUTE ONLY): 13 min  Charges:  $Gait Training: 8-22 mins                    G Codes:       Gerald Ray, Gerald Ray, Gerald Ray Gerald Ray 08/12/2017, 9:13 AM

## 2017-08-12 NOTE — Discharge Summary (Signed)
Physician Discharge Summary  Patient ID: Gerald Ray MRN: [id number] DOB/AGE: 73-02-1946 71 y.o.  Admit date: 08/10/2017 Discharge date: 08/12/2017 Discharge Diagnoses Fall from a height/Syncope Multiple right rib fractures with small hemopneumothorax T10 endplate compression fracture T7 vertebral body fracture  Consultants Neurosurgery  Procedures None  HPI: 71 year old gentleman came in as a trauma to following syncopal event and fall from deer stand. He was on the deer scan and felt dizzy, leaned his head against the tree and then lost consciousness. When he woke up he was on the ground. Complained of pain in the right lateral chest wall. Denied any current dizziness or nausea. Denied abdominal pain. Denied neck pain or joint pain. He denies any prior syncopal events but he does state that occasionally he has some dizziness and he attributes this to recently increasing metformin. Work up in the ED revealed the above listed injuries.   Hospital Course: Patient was admitted to the trauma service. Neurosurgery consulted for thoracic spine fractures and they recommended mobilization with a brace and outpatient follow up. Repeat CXR 12/27 showed no pneumothorax. PT/OT evaluated patient and recommended no follow up with supervision for mobility.   On 08/12/17 patient was tolerating a diet, mobilizing appropriately and pain well controlled. He was discharged home with follow up instructions as below. He knows to call with questions or concerns.   I have personally looked this patient up in the [place] Controlled Substance Database and reviewed their medications.  Allergies as of 08/12/2017   No Known Allergies     Medication List    TAKE these medications   ALPRAZolam 0.25 MG tablet Commonly known as:  XANAX Take 0.25 mg by mouth daily as needed.   ANTACID/ANTI-GAS PO Take 1 tablet by mouth as needed.   glimepiride 1 MG tablet Commonly known as:  AMARYL Take 1 mg by mouth daily.   lisinopril 20 MG tablet Commonly known as:  PRINIVIL,ZESTRIL Take 20 mg by mouth daily.   metFORMIN 1000 MG tablet Commonly known as:  GLUCOPHAGE Take 1,000 mg by mouth 2 (two) times daily.   methocarbamol 500 MG tablet Commonly known as:  ROBAXIN Take 1 tablet (500 mg total) by mouth 4 (four) times daily.   simvastatin 40 MG tablet Commonly known as:  ZOCOR Take 40 mg by mouth daily.   traMADol 50 MG tablet Commonly known as:  ULTRAM Take 1 tablet (50 mg total) by mouth every 6 (six) hours as needed for severe pain.        Follow-up Information    [facility]. Go on 08/23/2017.   Why:  Your appointment is at 9 AM. Please arrive 30 min prior to appointment time. Bring photo ID and insurance information.   Contact information: [address] [address] [phone]       Kary Kos, MD. Call.   Specialty:  Neurosurgery Why:  Call to arrange a follow up appointment in a few weeks. Contact information: [address] [address] [place] [address] [phone]        Lujean Amel, MD. Call.   Specialty:  Family Medicine Why:  Call and arrange to see someone in the next 1-2 weeks about medication management.  Contact information: [address] [address] [phone]           Signed: Brigid Re , [facility] 08/12/2017, 1:36 PM Pager: [phone] Trauma: [phone] Mon-Fri 7:00 am-4:30 pm Sat-Sun 7:00  am-11:30 am

## 2017-08-16 DIAGNOSIS — S22009A Unspecified fracture of unspecified thoracic vertebra, initial encounter for closed fracture: Secondary | ICD-10-CM

## 2017-08-16 DIAGNOSIS — S2239XA Fracture of one rib, unspecified side, initial encounter for closed fracture: Secondary | ICD-10-CM

## 2017-08-16 HISTORY — DX: Unspecified fracture of unspecified thoracic vertebra, initial encounter for closed fracture: S22.009A

## 2017-08-16 HISTORY — DX: Fracture of one rib, unspecified side, initial encounter for closed fracture: S22.39XA

## 2017-08-17 ENCOUNTER — Encounter (HOSPITAL_COMMUNITY): Payer: Self-pay | Admitting: Emergency Medicine

## 2017-08-23 DIAGNOSIS — S2239XA Fracture of one rib, unspecified side, initial encounter for closed fracture: Secondary | ICD-10-CM | POA: Diagnosis not present

## 2017-08-24 ENCOUNTER — Other Ambulatory Visit: Payer: Self-pay | Admitting: General Surgery

## 2017-08-24 ENCOUNTER — Ambulatory Visit
Admission: RE | Admit: 2017-08-24 | Discharge: 2017-08-24 | Disposition: A | Discharge status: Home / Self Care | Payer: PPO | Source: Ambulatory Visit | Attending: General Surgery | Admitting: General Surgery

## 2017-08-24 DIAGNOSIS — S2241XA Multiple fractures of ribs, right side, initial encounter for closed fracture: Secondary | ICD-10-CM | POA: Diagnosis not present

## 2017-08-24 DIAGNOSIS — S2249XA Multiple fractures of ribs, unspecified side, initial encounter for closed fracture: Secondary | ICD-10-CM

## 2017-08-30 ENCOUNTER — Telehealth: Payer: Self-pay | Admitting: *Deleted

## 2017-08-30 DIAGNOSIS — S2241XS Multiple fractures of ribs, right side, sequela: Secondary | ICD-10-CM | POA: Diagnosis not present

## 2017-08-30 DIAGNOSIS — R55 Syncope and collapse: Secondary | ICD-10-CM | POA: Diagnosis not present

## 2017-08-30 DIAGNOSIS — Z79899 Other long term (current) drug therapy: Secondary | ICD-10-CM | POA: Diagnosis not present

## 2017-08-30 DIAGNOSIS — I10 Essential (primary) hypertension: Secondary | ICD-10-CM | POA: Diagnosis not present

## 2017-08-30 DIAGNOSIS — E119 Type 2 diabetes mellitus without complications: Secondary | ICD-10-CM | POA: Diagnosis not present

## 2017-08-30 DIAGNOSIS — S22009D Unspecified fracture of unspecified thoracic vertebra, subsequent encounter for fracture with routine healing: Secondary | ICD-10-CM | POA: Diagnosis not present

## 2017-08-30 DIAGNOSIS — E7800 Pure hypercholesterolemia, unspecified: Secondary | ICD-10-CM | POA: Diagnosis not present

## 2017-08-30 DIAGNOSIS — T148XXA Other injury of unspecified body region, initial encounter: Secondary | ICD-10-CM | POA: Diagnosis not present

## 2017-08-30 DIAGNOSIS — Z7984 Long term (current) use of oral hypoglycemic drugs: Secondary | ICD-10-CM | POA: Diagnosis not present

## 2017-08-30 DIAGNOSIS — T148XXD Other injury of unspecified body region, subsequent encounter: Secondary | ICD-10-CM | POA: Diagnosis not present

## 2017-08-30 NOTE — Telephone Encounter (Signed)
REFERRAL SENT TO SCHEDULING.  °

## 2017-08-31 ENCOUNTER — Telehealth: Payer: Self-pay

## 2017-08-31 NOTE — Telephone Encounter (Signed)
Sent notes to scheduling from Dr. Versie Starks office- [facility] [place].

## 2017-09-02 ENCOUNTER — Other Ambulatory Visit: Payer: Self-pay | Admitting: Family Medicine

## 2017-09-02 DIAGNOSIS — R55 Syncope and collapse: Secondary | ICD-10-CM

## 2017-09-06 DIAGNOSIS — S22000A Wedge compression fracture of unspecified thoracic vertebra, initial encounter for closed fracture: Secondary | ICD-10-CM | POA: Diagnosis not present

## 2017-09-09 ENCOUNTER — Ambulatory Visit
Admission: RE | Admit: 2017-09-09 | Discharge: 2017-09-09 | Disposition: A | Discharge status: Home / Self Care | Payer: PPO | Source: Ambulatory Visit | Attending: Family Medicine | Admitting: Family Medicine

## 2017-09-09 DIAGNOSIS — I6523 Occlusion and stenosis of bilateral carotid arteries: Secondary | ICD-10-CM | POA: Diagnosis not present

## 2017-09-09 DIAGNOSIS — R55 Syncope and collapse: Secondary | ICD-10-CM

## 2017-09-14 DIAGNOSIS — R110 Nausea: Secondary | ICD-10-CM | POA: Diagnosis not present

## 2017-09-14 DIAGNOSIS — K219 Gastro-esophageal reflux disease without esophagitis: Secondary | ICD-10-CM | POA: Diagnosis not present

## 2017-09-14 DIAGNOSIS — T148XXA Other injury of unspecified body region, initial encounter: Secondary | ICD-10-CM | POA: Diagnosis not present

## 2017-10-06 DIAGNOSIS — S22000A Wedge compression fracture of unspecified thoracic vertebra, initial encounter for closed fracture: Secondary | ICD-10-CM | POA: Diagnosis not present

## 2017-10-06 DIAGNOSIS — Z6825 Body mass index (BMI) 25.0-25.9, adult: Secondary | ICD-10-CM | POA: Diagnosis not present

## 2017-10-13 DIAGNOSIS — S22000D Wedge compression fracture of unspecified thoracic vertebra, subsequent encounter for fracture with routine healing: Secondary | ICD-10-CM | POA: Diagnosis not present

## 2017-10-13 DIAGNOSIS — M546 Pain in thoracic spine: Secondary | ICD-10-CM | POA: Diagnosis not present

## 2017-10-17 DIAGNOSIS — S22000D Wedge compression fracture of unspecified thoracic vertebra, subsequent encounter for fracture with routine healing: Secondary | ICD-10-CM | POA: Diagnosis not present

## 2017-10-17 DIAGNOSIS — M546 Pain in thoracic spine: Secondary | ICD-10-CM | POA: Diagnosis not present

## 2017-10-20 DIAGNOSIS — M546 Pain in thoracic spine: Secondary | ICD-10-CM | POA: Diagnosis not present

## 2017-10-20 DIAGNOSIS — S22000D Wedge compression fracture of unspecified thoracic vertebra, subsequent encounter for fracture with routine healing: Secondary | ICD-10-CM | POA: Diagnosis not present

## 2017-10-26 DIAGNOSIS — S22000D Wedge compression fracture of unspecified thoracic vertebra, subsequent encounter for fracture with routine healing: Secondary | ICD-10-CM | POA: Diagnosis not present

## 2017-10-26 DIAGNOSIS — M546 Pain in thoracic spine: Secondary | ICD-10-CM | POA: Diagnosis not present

## 2017-11-03 DIAGNOSIS — S22000A Wedge compression fracture of unspecified thoracic vertebra, initial encounter for closed fracture: Secondary | ICD-10-CM | POA: Diagnosis not present

## 2017-11-03 DIAGNOSIS — M546 Pain in thoracic spine: Secondary | ICD-10-CM | POA: Diagnosis not present

## 2017-11-03 DIAGNOSIS — S22000D Wedge compression fracture of unspecified thoracic vertebra, subsequent encounter for fracture with routine healing: Secondary | ICD-10-CM | POA: Diagnosis not present

## 2017-11-08 DIAGNOSIS — M5414 Radiculopathy, thoracic region: Secondary | ICD-10-CM | POA: Diagnosis not present

## 2017-11-08 DIAGNOSIS — I10 Essential (primary) hypertension: Secondary | ICD-10-CM | POA: Diagnosis not present

## 2017-11-08 DIAGNOSIS — E119 Type 2 diabetes mellitus without complications: Secondary | ICD-10-CM | POA: Diagnosis not present

## 2017-11-08 DIAGNOSIS — M19049 Primary osteoarthritis, unspecified hand: Secondary | ICD-10-CM | POA: Diagnosis not present

## 2017-11-21 ENCOUNTER — Ambulatory Visit: Payer: PPO | Admitting: Cardiovascular Disease

## 2017-11-30 NOTE — Progress Notes (Signed)
Chief Complaint  Patient presents with  . New Patient (Initial Visit)    syncope   History of Present Illness: 72 yo male with history of DM, HLD, HTN and carotid artery disease here today as a new consult, referred by Dr. Dorthy Cooler, for the evaluation of syncope. He was admitted to [facility] in December 2018 after a syncopal event. He fell from a deer stand in a tree and fell to the ground after becoming dizzy and losing consciousness. He suffered thoracic spine fractures. No workup for syncope while admitted to the trauma service. He has no prior heart problems. He tells me that he was fatigued the day of the fall. He does not remember passing out. He woke up on the ground. No recurrent dizziness. No chest pain or dyspnea. No LE edema or weight gain. Recent carotid artery dopplers with mild carotid artery disease. He is very active. He is a Research officer, trade [employer].   Primary Care Physician: Lujean Amel, MD  Past Medical History:  Diagnosis Date  . Arthritis   . Bruise   . Depression   . Diabetes mellitus without complication ([place])   . Elevated cholesterol   . Hydronephrosis with ureteral calculus 11/2016   2 mm right ureteral calculus  . Hyperlipidemia   . Hypertension   . Renal disorder   . Rib fracture 08/2017   from a fall  . Syncope and collapse   . Thoracic spine fracture ([place]) 08/2017   from a fall    Past Surgical History:  Procedure Laterality Date  . CLAVICLE SURGERY     left  . LIH w/mesh  12/07/13  . SHOULDER SURGERY     growth on right  . SKIN GRAFT     right leg    Current Outpatient Medications  Medication Sig Dispense Refill  . ALPRAZolam (XANAX) 0.25 MG tablet Take 0.25 mg by mouth daily as needed.    Marland Kitchen aspirin 81 MG tablet Take 81 mg by mouth daily.    Marland Kitchen CINNAMON PO Take 1,000 mg by mouth.    Marland Kitchen glimepiride (AMARYL) 1 MG tablet Take 1 mg by mouth daily with breakfast.    . ibuprofen (ADVIL,MOTRIN) 800 MG tablet Take 1 tablet (800 mg total) by mouth 3 (three) times  daily. 21 tablet 0  . lisinopril (PRINIVIL,ZESTRIL) 20 MG tablet Take 20 mg by mouth daily.    . metFORMIN (GLUCOPHAGE) 1000 MG tablet Take 1,000 mg by mouth 2 (two) times daily.    . Multiple Vitamins-Minerals (SENTRY SENIOR PO) Take by mouth.    . naproxen (NAPROSYN) 500 MG tablet Take 500 mg by mouth as directed. Take 2-3 times a week    . omeprazole (PRILOSEC) 40 MG capsule Take 1 capsule by mouth daily.  3  . simvastatin (ZOCOR) 40 MG tablet Take 40 mg by mouth daily.    . tamsulosin (FLOMAX) 0.4 MG CAPS capsule Take 1 capsule (0.4 mg total) by mouth daily after supper. 7 capsule 0   No current facility-administered medications for this visit.     Allergies  Allergen Reactions  . Erythromycin Nausea Only and Other (See Comments)    Dizziness and stomach cramps  . Vytorin [Ezetimibe-Simvastatin] Other (See Comments)    Muscle pain    Social History   Socioeconomic History  . Marital status: Divorced    Spouse name: Not on file  . Number of children: 3  . Years of education: Not on file  . Highest education level: Not on  file  Occupational History  . Occupation: IT trainer  Social Needs  . Financial resource strain: Not on file  . Food insecurity:    Worry: Not on file    Inability: Not on file  . Transportation needs:    Medical: Not on file    Non-medical: Not on file  Tobacco Use  . Smoking status: Never Smoker  . Smokeless tobacco: Never Used  Substance and Sexual Activity  . Alcohol use: No  . Drug use: No  . Sexual activity: Never  Lifestyle  . Physical activity:    Days per week: Not on file    Minutes per session: Not on file  . Stress: Not on file  Relationships  . Social connections:    Talks on phone: Not on file    Gets together: Not on file    Attends religious service: Not on file    Active member of club or organization: Not on file    Attends meetings of clubs or organizations: Not on file    Relationship status: Not on file  .  Intimate partner violence:    Fear of current or ex partner: Not on file    Emotionally abused: Not on file    Physically abused: Not on file    Forced sexual activity: Not on file  Other Topics Concern  . Not on file  Social History Narrative   ** Merged History Encounter **        Family History  Problem Relation Age of Onset  . Diabetes Father   . CAD Father     Review of Systems:  As stated in the HPI and otherwise negative.   BP 130/70   Pulse 62   Ht 5\' 8"  (1.727 m)   Wt 170 lb 6.4 oz (77.3 kg)   SpO2 99%   BMI 25.91 kg/m   Physical Examination: General: Well developed, well nourished, NAD  HEENT: OP clear, mucus membranes moist  SKIN: warm, dry. No rashes. Neuro: No focal deficits  Musculoskeletal: Muscle strength 5/5 all ext  Psychiatric: Mood and affect normal  Neck: No JVD, no carotid bruits, no thyromegaly, no lymphadenopathy.  Lungs:Clear bilaterally, no wheezes, rhonci, crackles Cardiovascular: Regular rate and rhythm. No murmurs, gallops or rubs. Abdomen:Soft. Bowel sounds present. Non-tender.  Extremities: No lower extremity edema. Pulses are 2 + in the bilateral DP/PT.  EKG:  EKG is ordered today. The ekg ordered today demonstrates NSR, rate 62 bpm.   Recent Labs: 08/11/2017: ALT 35; BUN 29; Creatinine, Ser 1.29; Hemoglobin 11.7; Platelets 154; Potassium 4.2; Sodium 138   Lipid Panel No results found for: CHOL, TRIG, HDL, CHOLHDL, VLDL, LDLCALC, LDLDIRECT   Wt Readings from Last 3 Encounters:  12/01/17 170 lb 6.4 oz (77.3 kg)  08/11/17 177 lb 11.1 oz (80.6 kg)  11/18/16 178 lb (80.7 kg)     Other studies Reviewed: Additional studies/ records that were reviewed today include: . Review of the above records demonstrates:    Assessment and Plan:   1. Syncope: He has mild carotid artery disease. This would not have contributed to his syncope. His EKG is normal. Cardiac exam is normal. I think his syncope was probably vasovagal. Given his age,  will arrange echo to assess LV function and exclude structural heart disease.   Current medicines are reviewed at length with the patient today.  The patient does not have concerns regarding medicines.  The following changes have been made:  no change  Labs/ tests  ordered today include:   Orders Placed This Encounter  Procedures  . EKG 12-Lead  . ECHOCARDIOGRAM COMPLETE     Disposition:   FU with me in one year.    Signed, Lauree Chandler, MD 12/01/2017 11:06 AM    [address], [place], [place]  [address] Phone: [phone]; Fax: [phone]

## 2017-12-01 ENCOUNTER — Ambulatory Visit (INDEPENDENT_AMBULATORY_CARE_PROVIDER_SITE_OTHER): Payer: PPO | Admitting: Cardiovascular Disease

## 2017-12-01 ENCOUNTER — Encounter: Payer: Self-pay | Admitting: Cardiovascular Disease

## 2017-12-01 VITALS — BP 130/70 | HR 62 | Ht 68.0 in | Wt 170.4 lb

## 2017-12-01 DIAGNOSIS — R55 Syncope and collapse: Secondary | ICD-10-CM | POA: Diagnosis not present

## 2017-12-01 NOTE — Patient Instructions (Addendum)
Medication Instructions:  Your physician recommends that you continue on your current medications as directed. Please refer to the Current Medication list given to you today.   Labwork: none  Testing/Procedures: Your physician has requested that you have an echocardiogram. Echocardiography is a painless test that uses sound waves to create images of your heart. It provides your doctor with information about the size and shape of your heart and how well your heart's chambers and valves are working. This procedure takes approximately one hour. There are no restrictions for this procedure.    Follow-Up: Your physician recommends that you schedule a follow-up appointment in: 12 months. Please call our office in December or January to schedule this appointment    Any Other Special Instructions Will Be Listed Below (If Applicable).     If you need a refill on your cardiac medications before your next appointment, please call your pharmacy.

## 2017-12-21 ENCOUNTER — Other Ambulatory Visit: Payer: Self-pay

## 2017-12-21 ENCOUNTER — Ambulatory Visit (HOSPITAL_COMMUNITY): Payer: PPO | Attending: Cardiology

## 2017-12-21 DIAGNOSIS — E119 Type 2 diabetes mellitus without complications: Secondary | ICD-10-CM | POA: Insufficient documentation

## 2017-12-21 DIAGNOSIS — R55 Syncope and collapse: Secondary | ICD-10-CM | POA: Diagnosis not present

## 2017-12-21 DIAGNOSIS — I340 Nonrheumatic mitral (valve) insufficiency: Secondary | ICD-10-CM | POA: Insufficient documentation

## 2017-12-21 DIAGNOSIS — I10 Essential (primary) hypertension: Secondary | ICD-10-CM | POA: Diagnosis not present

## 2017-12-21 DIAGNOSIS — E785 Hyperlipidemia, unspecified: Secondary | ICD-10-CM | POA: Insufficient documentation

## 2017-12-22 ENCOUNTER — Ambulatory Visit: Payer: PPO | Admitting: Cardiology

## 2018-02-08 DIAGNOSIS — H25013 Cortical age-related cataract, bilateral: Secondary | ICD-10-CM | POA: Diagnosis not present

## 2018-02-08 DIAGNOSIS — H5203 Hypermetropia, bilateral: Secondary | ICD-10-CM | POA: Diagnosis not present

## 2018-02-08 DIAGNOSIS — H35373 Puckering of macula, bilateral: Secondary | ICD-10-CM | POA: Diagnosis not present

## 2018-02-08 DIAGNOSIS — H2513 Age-related nuclear cataract, bilateral: Secondary | ICD-10-CM | POA: Diagnosis not present

## 2018-02-28 DIAGNOSIS — Z0000 Encounter for general adult medical examination without abnormal findings: Secondary | ICD-10-CM | POA: Diagnosis not present

## 2018-02-28 DIAGNOSIS — F419 Anxiety disorder, unspecified: Secondary | ICD-10-CM | POA: Diagnosis not present

## 2018-02-28 DIAGNOSIS — I10 Essential (primary) hypertension: Secondary | ICD-10-CM | POA: Diagnosis not present

## 2018-02-28 DIAGNOSIS — Z79899 Other long term (current) drug therapy: Secondary | ICD-10-CM | POA: Diagnosis not present

## 2018-02-28 DIAGNOSIS — Z136 Encounter for screening for cardiovascular disorders: Secondary | ICD-10-CM | POA: Diagnosis not present

## 2018-02-28 DIAGNOSIS — E7800 Pure hypercholesterolemia, unspecified: Secondary | ICD-10-CM | POA: Diagnosis not present

## 2018-02-28 DIAGNOSIS — E119 Type 2 diabetes mellitus without complications: Secondary | ICD-10-CM | POA: Diagnosis not present

## 2018-02-28 DIAGNOSIS — M19049 Primary osteoarthritis, unspecified hand: Secondary | ICD-10-CM | POA: Diagnosis not present

## 2018-03-14 DIAGNOSIS — H25011 Cortical age-related cataract, right eye: Secondary | ICD-10-CM | POA: Diagnosis not present

## 2018-03-14 DIAGNOSIS — H25811 Combined forms of age-related cataract, right eye: Secondary | ICD-10-CM | POA: Diagnosis not present

## 2018-03-14 DIAGNOSIS — H2511 Age-related nuclear cataract, right eye: Secondary | ICD-10-CM | POA: Diagnosis not present

## 2018-03-28 DIAGNOSIS — H25012 Cortical age-related cataract, left eye: Secondary | ICD-10-CM | POA: Diagnosis not present

## 2018-03-28 DIAGNOSIS — H2512 Age-related nuclear cataract, left eye: Secondary | ICD-10-CM | POA: Diagnosis not present

## 2018-03-28 DIAGNOSIS — H25812 Combined forms of age-related cataract, left eye: Secondary | ICD-10-CM | POA: Diagnosis not present

## 2018-04-05 DIAGNOSIS — H35351 Cystoid macular degeneration, right eye: Secondary | ICD-10-CM | POA: Diagnosis not present

## 2018-04-05 DIAGNOSIS — H35373 Puckering of macula, bilateral: Secondary | ICD-10-CM | POA: Diagnosis not present

## 2018-04-27 DIAGNOSIS — H35373 Puckering of macula, bilateral: Secondary | ICD-10-CM | POA: Diagnosis not present

## 2018-09-10 IMAGING — CR DG CHEST 2V
2 series · 2 of 2 positions shown · non-contrast
Comparison: None recent. Abdominal CT 11/18/2016. No prior chest
radiographs.

CLINICAL DATA: Recent fall from [place]. Multiple rib fractures.
New right-sided chest sounds. Concern for effusion versus
pneumothorax.

EXAM:
CHEST  2 VIEW

[w chest pa]
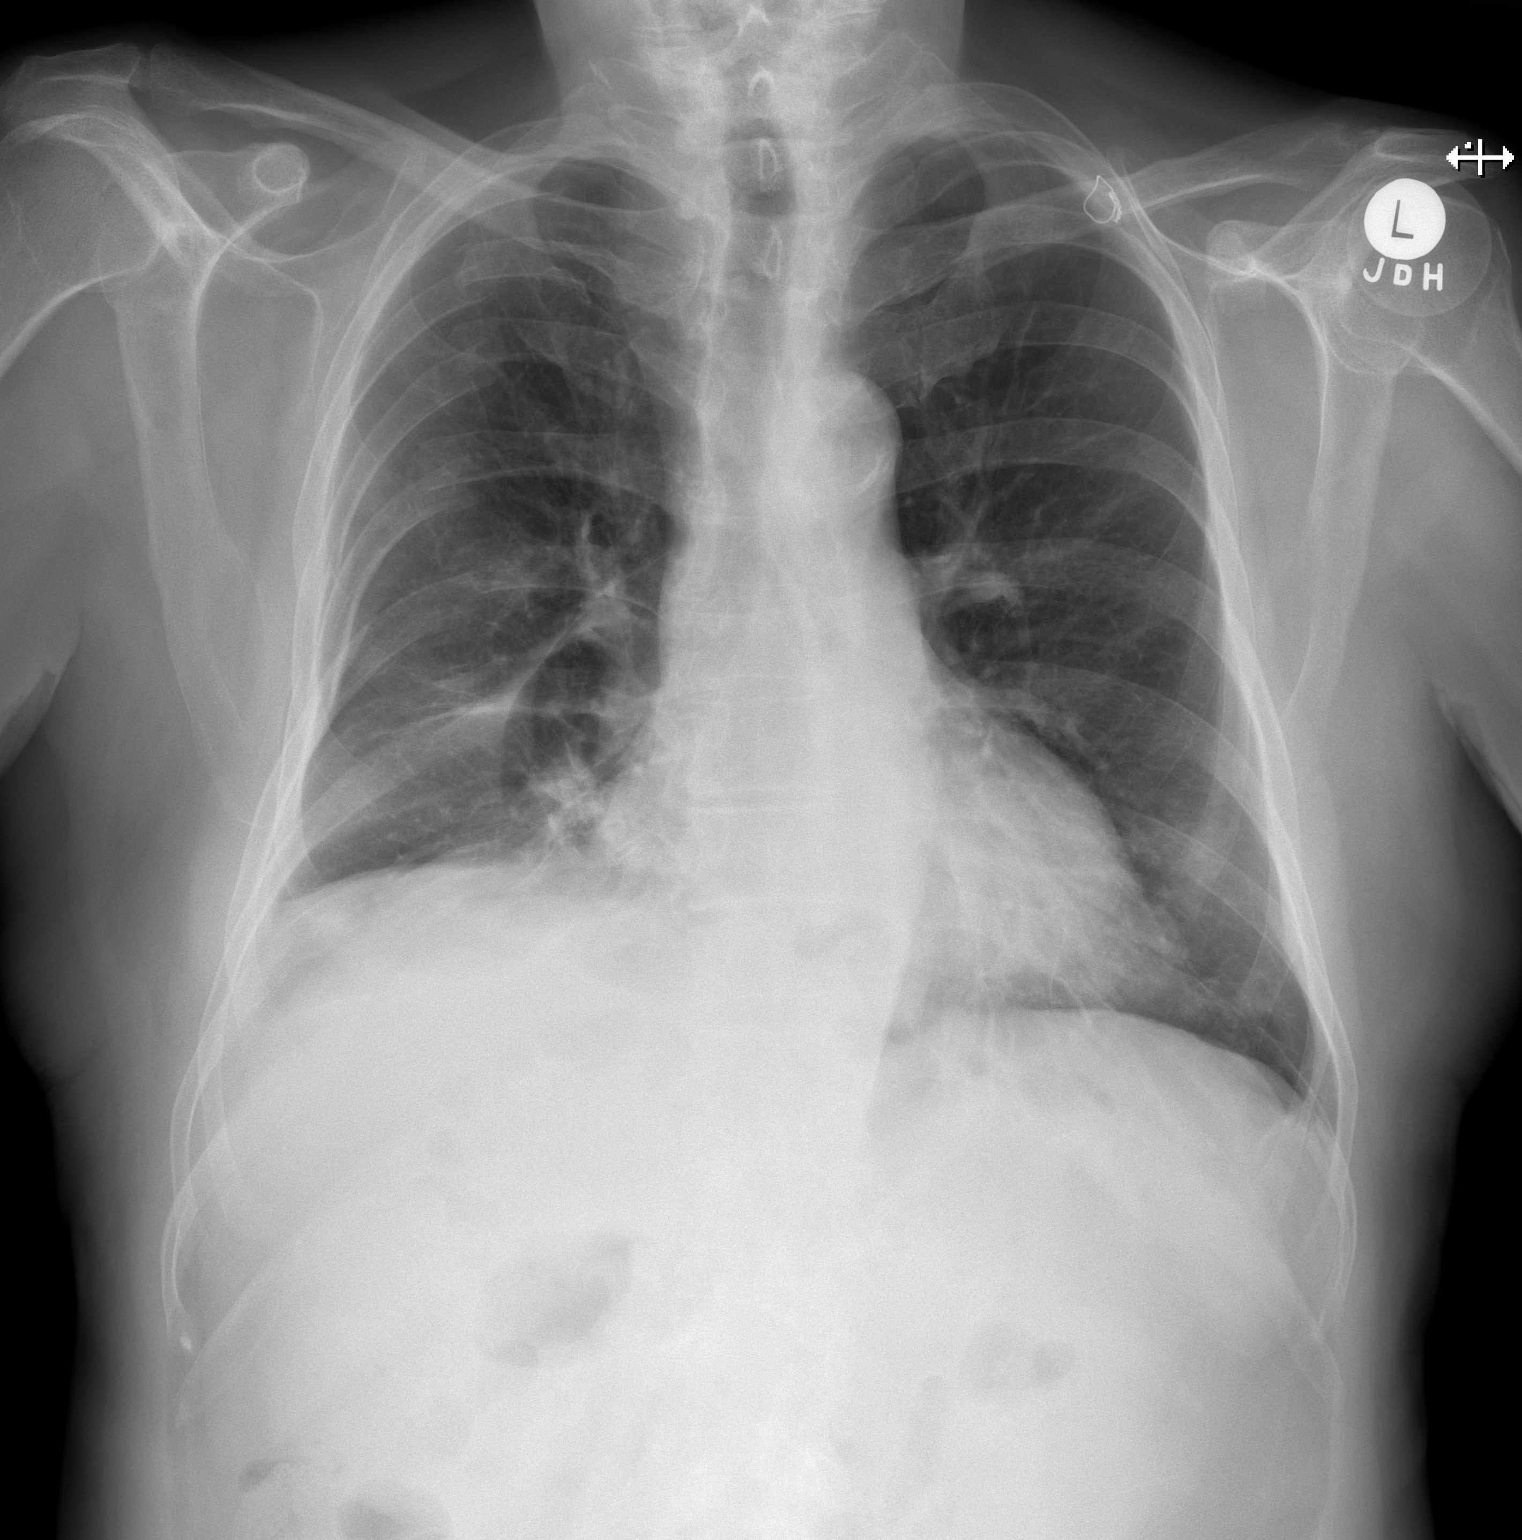

[w chest lat]
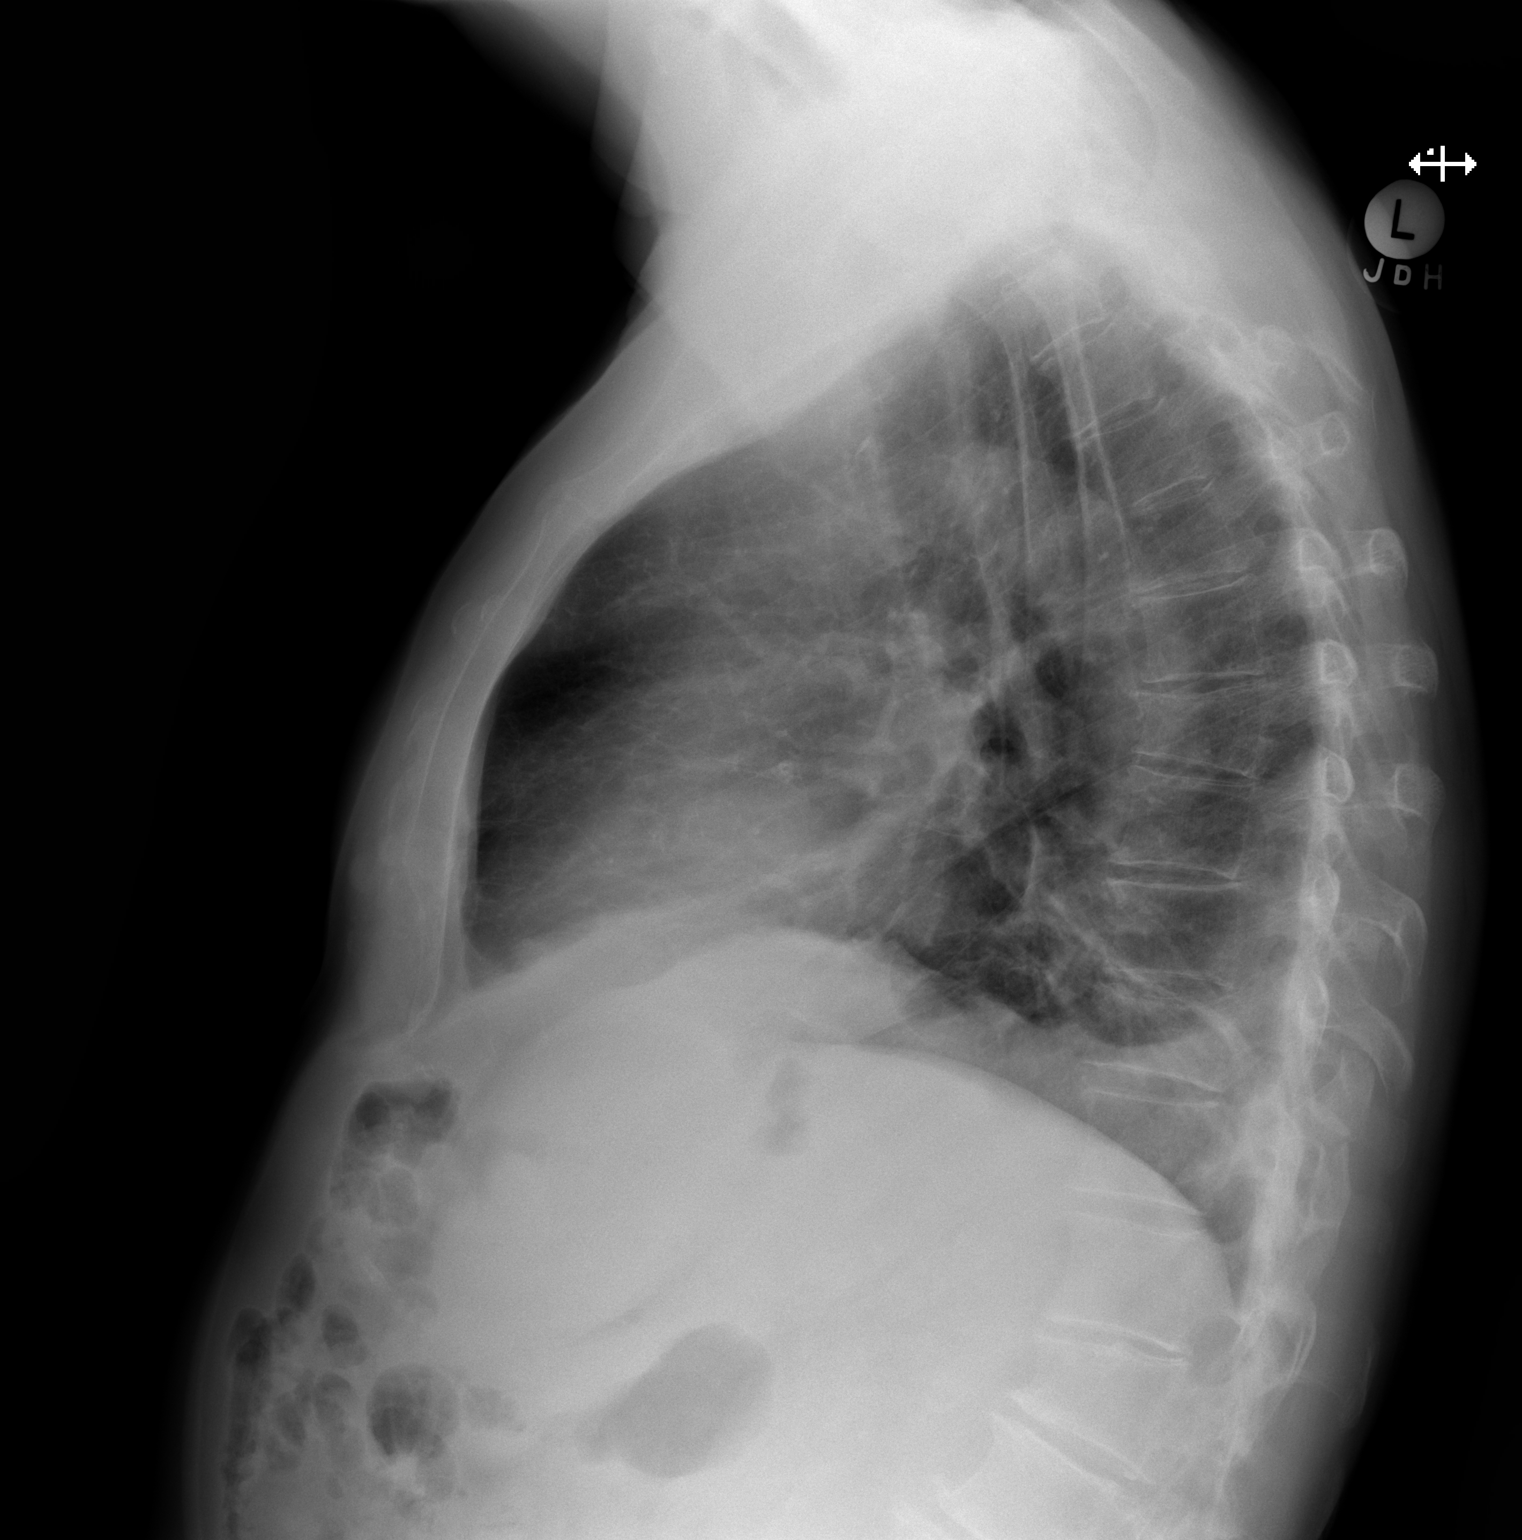

[2 of 2 positions shown; findings below may reference images not displayed]

FINDINGS: The heart size and mediastinal contours are normal. There is mild
aortic atherosclerosis. There is a small right pleural effusion with
mild right basilar atelectasis. No evidence of pneumothorax. The
left lung is clear.

There are multiple right-sided rib fractures which are moderately
displaced. Some of these appear segmental, involving at least the
right 4th through 7th ribs. Mild wedging of the L1 vertebral body
appears unchanged from available prior studies. There are age
indeterminate mild compression deformities at T7 and T10.
IMPRESSION: 1. Multiple moderately displaced and segmental right-sided rib
fractures.
2. Small right pleural effusion and mild right basilar atelectasis.
No pneumothorax.
3. Age-indeterminate compression deformities at T7 and T8.

## 2018-09-22 DIAGNOSIS — T798XXA Other early complications of trauma, initial encounter: Secondary | ICD-10-CM | POA: Diagnosis not present

## 2018-09-22 DIAGNOSIS — S60552A Superficial foreign body of left hand, initial encounter: Secondary | ICD-10-CM | POA: Diagnosis not present

## 2018-09-25 DIAGNOSIS — L02512 Cutaneous abscess of left hand: Secondary | ICD-10-CM | POA: Diagnosis not present

## 2019-03-16 DIAGNOSIS — Z7984 Long term (current) use of oral hypoglycemic drugs: Secondary | ICD-10-CM | POA: Diagnosis not present

## 2019-03-16 DIAGNOSIS — F419 Anxiety disorder, unspecified: Secondary | ICD-10-CM | POA: Diagnosis not present

## 2019-03-16 DIAGNOSIS — Z79899 Other long term (current) drug therapy: Secondary | ICD-10-CM | POA: Diagnosis not present

## 2019-03-16 DIAGNOSIS — E119 Type 2 diabetes mellitus without complications: Secondary | ICD-10-CM | POA: Diagnosis not present

## 2019-03-16 DIAGNOSIS — E7800 Pure hypercholesterolemia, unspecified: Secondary | ICD-10-CM | POA: Diagnosis not present

## 2019-03-26 DIAGNOSIS — E7800 Pure hypercholesterolemia, unspecified: Secondary | ICD-10-CM | POA: Diagnosis not present

## 2019-03-26 DIAGNOSIS — Z79899 Other long term (current) drug therapy: Secondary | ICD-10-CM | POA: Diagnosis not present

## 2019-03-26 DIAGNOSIS — E119 Type 2 diabetes mellitus without complications: Secondary | ICD-10-CM | POA: Diagnosis not present

## 2019-04-09 DIAGNOSIS — H35373 Puckering of macula, bilateral: Secondary | ICD-10-CM | POA: Diagnosis not present

## 2019-04-09 DIAGNOSIS — E113293 Type 2 diabetes mellitus with mild nonproliferative diabetic retinopathy without macular edema, bilateral: Secondary | ICD-10-CM | POA: Diagnosis not present

## 2019-04-09 DIAGNOSIS — H5310 Unspecified subjective visual disturbances: Secondary | ICD-10-CM | POA: Diagnosis not present

## 2019-04-09 DIAGNOSIS — H43813 Vitreous degeneration, bilateral: Secondary | ICD-10-CM | POA: Diagnosis not present

## 2019-04-20 DIAGNOSIS — E119 Type 2 diabetes mellitus without complications: Secondary | ICD-10-CM | POA: Diagnosis not present

## 2019-04-20 DIAGNOSIS — F419 Anxiety disorder, unspecified: Secondary | ICD-10-CM | POA: Diagnosis not present

## 2019-04-20 DIAGNOSIS — Z7984 Long term (current) use of oral hypoglycemic drugs: Secondary | ICD-10-CM | POA: Diagnosis not present

## 2019-06-27 DIAGNOSIS — E1169 Type 2 diabetes mellitus with other specified complication: Secondary | ICD-10-CM | POA: Diagnosis not present

## 2019-06-27 DIAGNOSIS — Z79899 Other long term (current) drug therapy: Secondary | ICD-10-CM | POA: Diagnosis not present

## 2019-06-27 DIAGNOSIS — M180 Bilateral primary osteoarthritis of first carpometacarpal joints: Secondary | ICD-10-CM | POA: Diagnosis not present

## 2019-06-27 DIAGNOSIS — M898X1 Other specified disorders of bone, shoulder: Secondary | ICD-10-CM | POA: Diagnosis not present

## 2019-06-27 DIAGNOSIS — E039 Hypothyroidism, unspecified: Secondary | ICD-10-CM | POA: Diagnosis not present

## 2019-06-27 DIAGNOSIS — E7800 Pure hypercholesterolemia, unspecified: Secondary | ICD-10-CM | POA: Diagnosis not present

## 2019-06-27 DIAGNOSIS — F419 Anxiety disorder, unspecified: Secondary | ICD-10-CM | POA: Diagnosis not present

## 2019-06-27 DIAGNOSIS — Z7984 Long term (current) use of oral hypoglycemic drugs: Secondary | ICD-10-CM | POA: Diagnosis not present

## 2019-07-17 ENCOUNTER — Other Ambulatory Visit: Payer: Self-pay | Admitting: Family Medicine

## 2019-07-17 ENCOUNTER — Ambulatory Visit
Admission: RE | Admit: 2019-07-17 | Discharge: 2019-07-17 | Disposition: A | Discharge status: Home / Self Care | Payer: PPO | Source: Ambulatory Visit | Attending: Family Medicine | Admitting: Family Medicine

## 2019-07-17 ENCOUNTER — Other Ambulatory Visit: Payer: Self-pay

## 2019-07-17 ENCOUNTER — Encounter: Payer: Self-pay | Admitting: Radiology

## 2019-07-17 ENCOUNTER — Other Ambulatory Visit: Payer: PPO

## 2019-07-17 DIAGNOSIS — R1032 Left lower quadrant pain: Secondary | ICD-10-CM

## 2019-07-17 DIAGNOSIS — D649 Anemia, unspecified: Secondary | ICD-10-CM | POA: Diagnosis not present

## 2019-07-17 DIAGNOSIS — N200 Calculus of kidney: Secondary | ICD-10-CM | POA: Diagnosis not present

## 2019-07-18 DIAGNOSIS — D649 Anemia, unspecified: Secondary | ICD-10-CM | POA: Diagnosis not present

## 2020-01-10 DIAGNOSIS — D649 Anemia, unspecified: Secondary | ICD-10-CM | POA: Diagnosis not present

## 2020-01-10 DIAGNOSIS — Z0001 Encounter for general adult medical examination with abnormal findings: Secondary | ICD-10-CM | POA: Diagnosis not present

## 2020-01-10 DIAGNOSIS — E7800 Pure hypercholesterolemia, unspecified: Secondary | ICD-10-CM | POA: Diagnosis not present

## 2020-01-10 DIAGNOSIS — E039 Hypothyroidism, unspecified: Secondary | ICD-10-CM | POA: Diagnosis not present

## 2020-01-10 DIAGNOSIS — M549 Dorsalgia, unspecified: Secondary | ICD-10-CM | POA: Diagnosis not present

## 2020-01-10 DIAGNOSIS — F419 Anxiety disorder, unspecified: Secondary | ICD-10-CM | POA: Diagnosis not present

## 2020-01-10 DIAGNOSIS — I10 Essential (primary) hypertension: Secondary | ICD-10-CM | POA: Diagnosis not present

## 2020-01-10 DIAGNOSIS — Z79899 Other long term (current) drug therapy: Secondary | ICD-10-CM | POA: Diagnosis not present

## 2020-01-10 DIAGNOSIS — E1169 Type 2 diabetes mellitus with other specified complication: Secondary | ICD-10-CM | POA: Diagnosis not present

## 2020-01-10 DIAGNOSIS — R634 Abnormal weight loss: Secondary | ICD-10-CM | POA: Diagnosis not present

## 2020-01-10 DIAGNOSIS — Z125 Encounter for screening for malignant neoplasm of prostate: Secondary | ICD-10-CM | POA: Diagnosis not present

## 2020-01-17 DIAGNOSIS — H5203 Hypermetropia, bilateral: Secondary | ICD-10-CM | POA: Diagnosis not present

## 2020-01-17 DIAGNOSIS — H35373 Puckering of macula, bilateral: Secondary | ICD-10-CM | POA: Diagnosis not present

## 2020-01-17 DIAGNOSIS — H524 Presbyopia: Secondary | ICD-10-CM | POA: Diagnosis not present

## 2020-01-17 DIAGNOSIS — Z961 Presence of intraocular lens: Secondary | ICD-10-CM | POA: Diagnosis not present

## 2020-01-24 ENCOUNTER — Encounter: Payer: Self-pay | Admitting: General Practice

## 2020-02-20 DIAGNOSIS — L03031 Cellulitis of right toe: Secondary | ICD-10-CM | POA: Diagnosis not present

## 2020-02-25 DIAGNOSIS — S99921D Unspecified injury of right foot, subsequent encounter: Secondary | ICD-10-CM | POA: Diagnosis not present

## 2020-02-25 DIAGNOSIS — L84 Corns and callosities: Secondary | ICD-10-CM | POA: Diagnosis not present

## 2020-03-04 ENCOUNTER — Ambulatory Visit
Admission: RE | Admit: 2020-03-04 | Discharge: 2020-03-04 | Disposition: A | Discharge status: Home / Self Care | Payer: PPO | Source: Ambulatory Visit | Attending: Family Medicine | Admitting: Family Medicine

## 2020-03-04 ENCOUNTER — Other Ambulatory Visit: Payer: Self-pay

## 2020-03-04 ENCOUNTER — Other Ambulatory Visit: Payer: Self-pay | Admitting: Family Medicine

## 2020-03-04 DIAGNOSIS — M549 Dorsalgia, unspecified: Secondary | ICD-10-CM

## 2020-03-04 DIAGNOSIS — M5134 Other intervertebral disc degeneration, thoracic region: Secondary | ICD-10-CM | POA: Diagnosis not present

## 2020-03-04 DIAGNOSIS — M4856XA Collapsed vertebra, not elsewhere classified, lumbar region, initial encounter for fracture: Secondary | ICD-10-CM | POA: Diagnosis not present

## 2020-03-04 DIAGNOSIS — M4854XA Collapsed vertebra, not elsewhere classified, thoracic region, initial encounter for fracture: Secondary | ICD-10-CM | POA: Diagnosis not present

## 2020-03-04 DIAGNOSIS — M419 Scoliosis, unspecified: Secondary | ICD-10-CM | POA: Diagnosis not present

## 2020-03-31 DIAGNOSIS — Z86010 Personal history of colonic polyps: Secondary | ICD-10-CM | POA: Diagnosis not present

## 2020-03-31 DIAGNOSIS — D649 Anemia, unspecified: Secondary | ICD-10-CM | POA: Diagnosis not present

## 2020-03-31 DIAGNOSIS — R197 Diarrhea, unspecified: Secondary | ICD-10-CM | POA: Diagnosis not present

## 2020-03-31 DIAGNOSIS — R1319 Other dysphagia: Secondary | ICD-10-CM | POA: Diagnosis not present

## 2020-03-31 DIAGNOSIS — R634 Abnormal weight loss: Secondary | ICD-10-CM | POA: Diagnosis not present

## 2020-03-31 DIAGNOSIS — K5732 Diverticulitis of large intestine without perforation or abscess without bleeding: Secondary | ICD-10-CM | POA: Diagnosis not present

## 2020-04-07 DIAGNOSIS — R197 Diarrhea, unspecified: Secondary | ICD-10-CM | POA: Diagnosis not present

## 2020-04-08 DIAGNOSIS — M549 Dorsalgia, unspecified: Secondary | ICD-10-CM | POA: Diagnosis not present

## 2020-05-19 DIAGNOSIS — Z1159 Encounter for screening for other viral diseases: Secondary | ICD-10-CM | POA: Diagnosis not present

## 2020-05-22 DIAGNOSIS — R197 Diarrhea, unspecified: Secondary | ICD-10-CM | POA: Diagnosis not present

## 2020-05-22 DIAGNOSIS — K449 Diaphragmatic hernia without obstruction or gangrene: Secondary | ICD-10-CM | POA: Diagnosis not present

## 2020-05-22 DIAGNOSIS — K2930 Chronic superficial gastritis without bleeding: Secondary | ICD-10-CM | POA: Diagnosis not present

## 2020-05-22 DIAGNOSIS — K5732 Diverticulitis of large intestine without perforation or abscess without bleeding: Secondary | ICD-10-CM | POA: Diagnosis not present

## 2020-05-22 DIAGNOSIS — K5730 Diverticulosis of large intestine without perforation or abscess without bleeding: Secondary | ICD-10-CM | POA: Diagnosis not present

## 2020-05-22 DIAGNOSIS — R634 Abnormal weight loss: Secondary | ICD-10-CM | POA: Diagnosis not present

## 2020-05-22 DIAGNOSIS — R1310 Dysphagia, unspecified: Secondary | ICD-10-CM | POA: Diagnosis not present

## 2020-05-22 DIAGNOSIS — K648 Other hemorrhoids: Secondary | ICD-10-CM | POA: Diagnosis not present

## 2020-05-29 DIAGNOSIS — K2930 Chronic superficial gastritis without bleeding: Secondary | ICD-10-CM | POA: Diagnosis not present

## 2020-07-09 DIAGNOSIS — I10 Essential (primary) hypertension: Secondary | ICD-10-CM | POA: Diagnosis not present

## 2020-07-09 DIAGNOSIS — Z79899 Other long term (current) drug therapy: Secondary | ICD-10-CM | POA: Diagnosis not present

## 2020-07-09 DIAGNOSIS — E1169 Type 2 diabetes mellitus with other specified complication: Secondary | ICD-10-CM | POA: Diagnosis not present

## 2020-07-09 DIAGNOSIS — Z23 Encounter for immunization: Secondary | ICD-10-CM | POA: Diagnosis not present

## 2020-07-09 DIAGNOSIS — E7800 Pure hypercholesterolemia, unspecified: Secondary | ICD-10-CM | POA: Diagnosis not present

## 2020-07-09 DIAGNOSIS — F419 Anxiety disorder, unspecified: Secondary | ICD-10-CM | POA: Diagnosis not present

## 2020-07-25 DIAGNOSIS — E1169 Type 2 diabetes mellitus with other specified complication: Secondary | ICD-10-CM | POA: Diagnosis not present

## 2020-07-25 DIAGNOSIS — Z79899 Other long term (current) drug therapy: Secondary | ICD-10-CM | POA: Diagnosis not present

## 2020-07-28 DIAGNOSIS — I10 Essential (primary) hypertension: Secondary | ICD-10-CM | POA: Diagnosis not present

## 2020-09-04 DIAGNOSIS — E119 Type 2 diabetes mellitus without complications: Secondary | ICD-10-CM | POA: Diagnosis not present

## 2020-09-04 DIAGNOSIS — I10 Essential (primary) hypertension: Secondary | ICD-10-CM | POA: Diagnosis not present

## 2021-02-24 DIAGNOSIS — E7800 Pure hypercholesterolemia, unspecified: Secondary | ICD-10-CM | POA: Diagnosis not present

## 2021-02-24 DIAGNOSIS — E1169 Type 2 diabetes mellitus with other specified complication: Secondary | ICD-10-CM | POA: Diagnosis not present

## 2021-02-24 DIAGNOSIS — H9392 Unspecified disorder of left ear: Secondary | ICD-10-CM | POA: Diagnosis not present

## 2021-02-24 DIAGNOSIS — R1013 Epigastric pain: Secondary | ICD-10-CM | POA: Diagnosis not present

## 2021-02-24 DIAGNOSIS — I10 Essential (primary) hypertension: Secondary | ICD-10-CM | POA: Diagnosis not present

## 2021-02-27 ENCOUNTER — Other Ambulatory Visit: Payer: Self-pay | Admitting: Family Medicine

## 2021-02-27 DIAGNOSIS — R1013 Epigastric pain: Secondary | ICD-10-CM

## 2021-03-05 DIAGNOSIS — E7800 Pure hypercholesterolemia, unspecified: Secondary | ICD-10-CM | POA: Diagnosis not present

## 2021-03-05 DIAGNOSIS — E1169 Type 2 diabetes mellitus with other specified complication: Secondary | ICD-10-CM | POA: Diagnosis not present

## 2021-03-05 DIAGNOSIS — L84 Corns and callosities: Secondary | ICD-10-CM | POA: Diagnosis not present

## 2021-03-05 DIAGNOSIS — I10 Essential (primary) hypertension: Secondary | ICD-10-CM | POA: Diagnosis not present

## 2021-03-05 DIAGNOSIS — Z0001 Encounter for general adult medical examination with abnormal findings: Secondary | ICD-10-CM | POA: Diagnosis not present

## 2021-03-05 DIAGNOSIS — Z79899 Other long term (current) drug therapy: Secondary | ICD-10-CM | POA: Diagnosis not present

## 2021-03-05 DIAGNOSIS — F419 Anxiety disorder, unspecified: Secondary | ICD-10-CM | POA: Diagnosis not present

## 2021-03-11 ENCOUNTER — Other Ambulatory Visit: Payer: Self-pay

## 2021-03-11 ENCOUNTER — Ambulatory Visit
Admission: RE | Admit: 2021-03-11 | Discharge: 2021-03-11 | Disposition: A | Discharge status: Home / Self Care | Payer: PPO | Source: Ambulatory Visit | Attending: Family Medicine | Admitting: Family Medicine

## 2021-03-11 DIAGNOSIS — K449 Diaphragmatic hernia without obstruction or gangrene: Secondary | ICD-10-CM | POA: Diagnosis not present

## 2021-03-11 DIAGNOSIS — S22070A Wedge compression fracture of T9-T10 vertebra, initial encounter for closed fracture: Secondary | ICD-10-CM | POA: Diagnosis not present

## 2021-03-11 DIAGNOSIS — K5730 Diverticulosis of large intestine without perforation or abscess without bleeding: Secondary | ICD-10-CM | POA: Diagnosis not present

## 2021-03-11 DIAGNOSIS — N281 Cyst of kidney, acquired: Secondary | ICD-10-CM | POA: Diagnosis not present

## 2021-03-11 DIAGNOSIS — R1013 Epigastric pain: Secondary | ICD-10-CM

## 2021-03-11 MED ORDER — IOPAMIDOL (ISOVUE-300) INJECTION 61%
100.0000 mL | Freq: Once | INTRAVENOUS | Status: AC | PRN
  Administered 2021-03-11: 100 mL via INTRAVENOUS

## 2021-03-21 IMAGING — CR DG THORACIC SPINE 3V
3 series · 3 of 3 positions shown · non-contrast
Comparison: 10/06/2017

CLINICAL DATA: Upper back pain.  History of thoracic fracture

EXAM:
THORACIC SPINE - 3 VIEWS

[t thoracic spine ap]
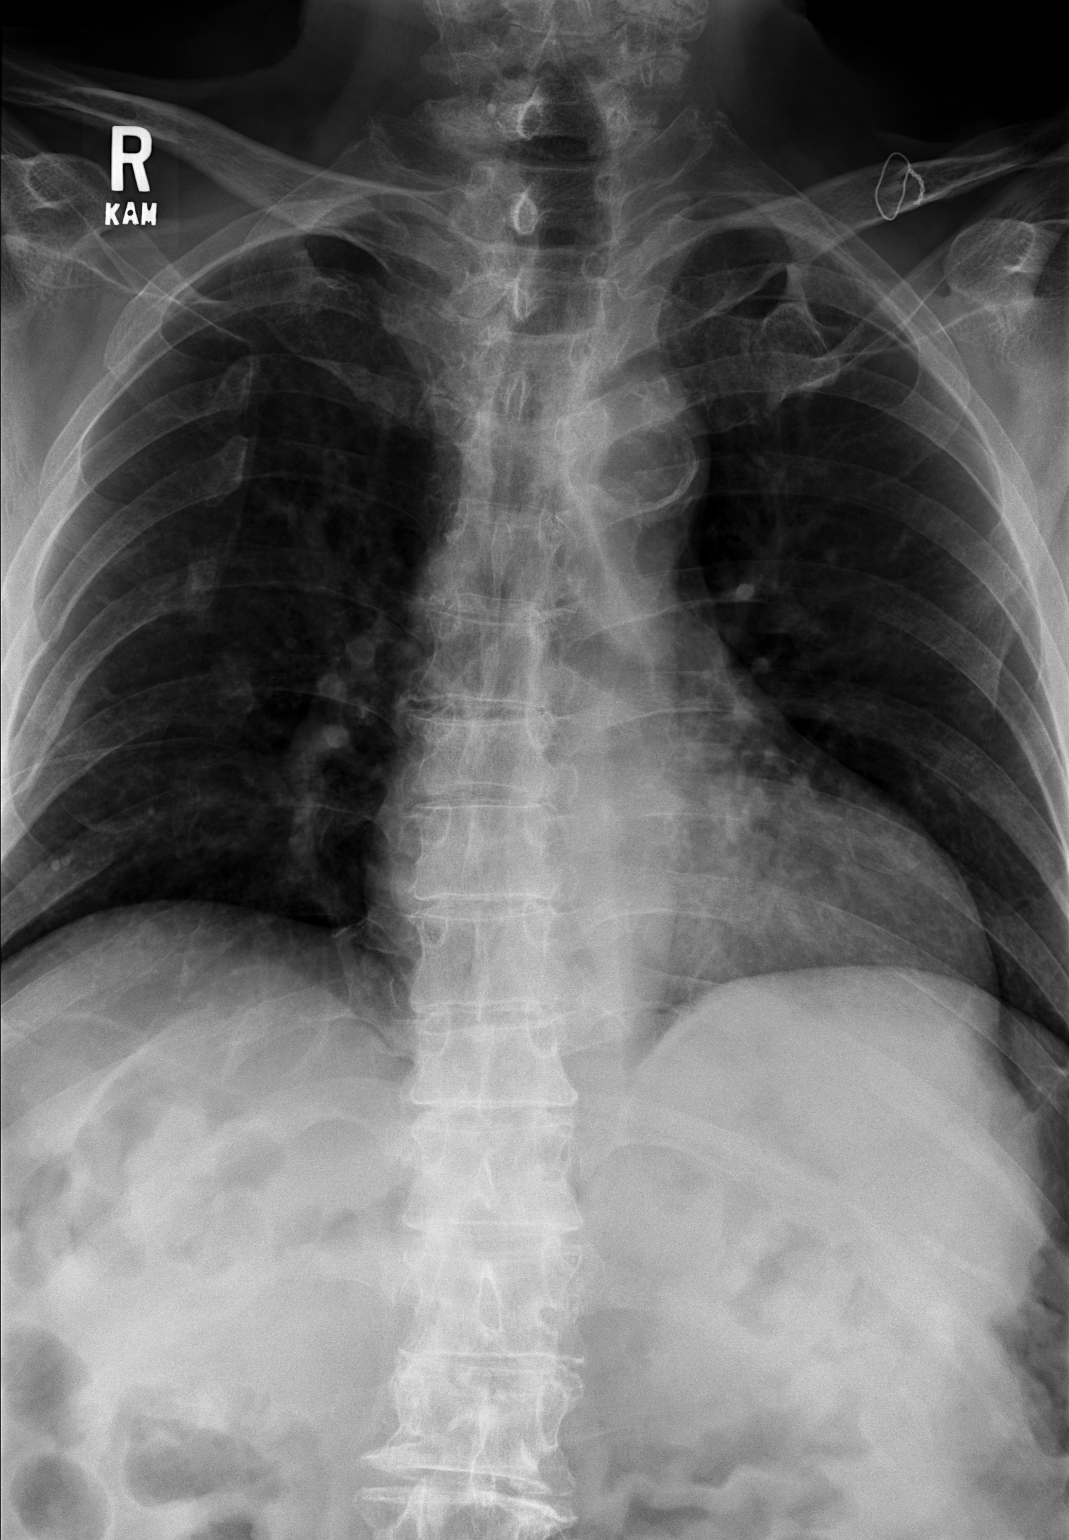

[t thoracic spine lat]
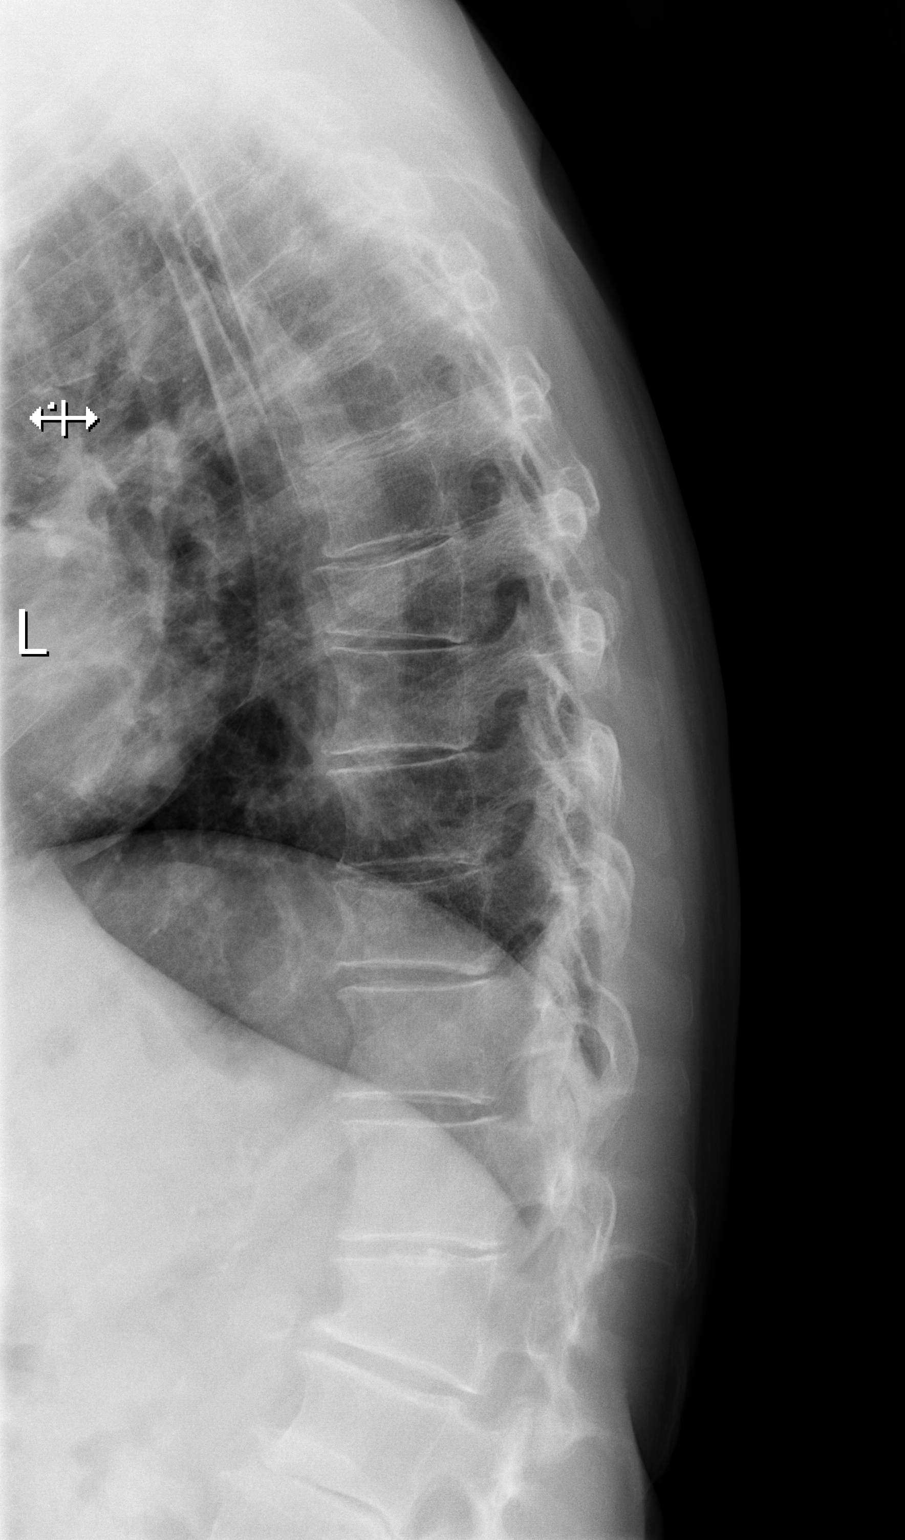

[t thoracic swimmers]
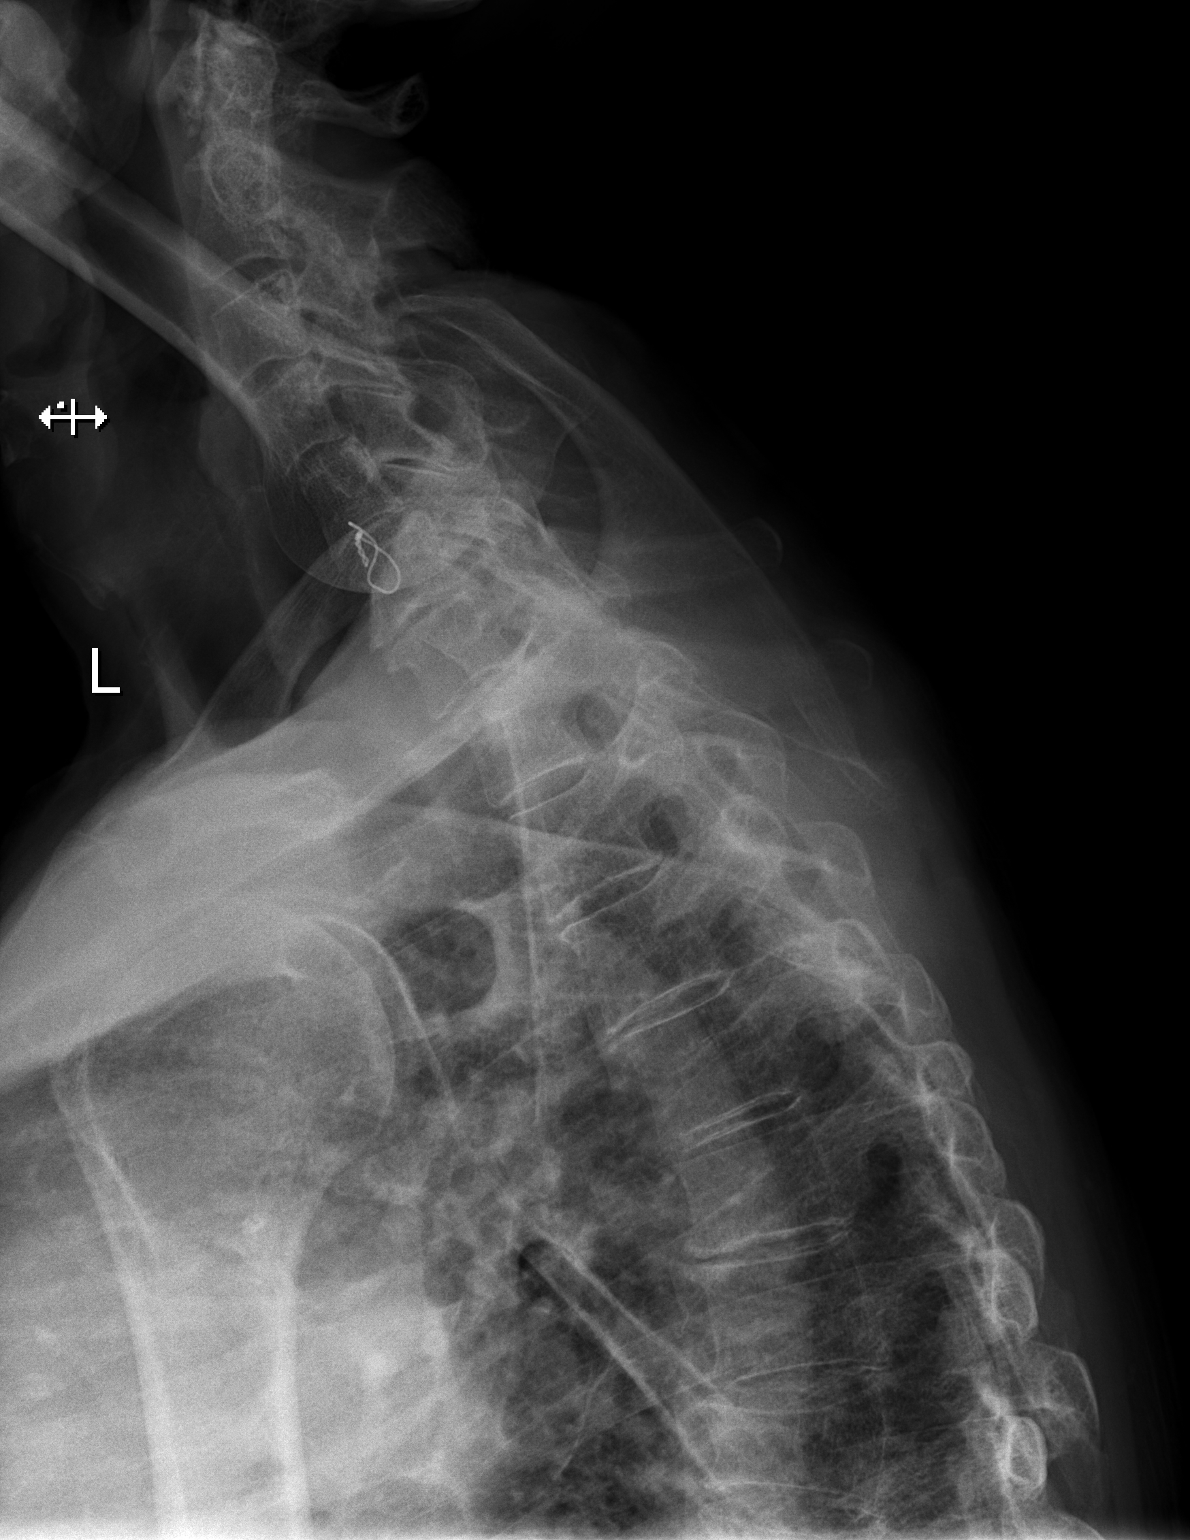

[3 of 3 positions shown; findings below may reference images not displayed]

FINDINGS: Mild compression fracture approximately T7 is unchanged. Mild
chronic compression fracture T10 unchanged. Moderate compression
fracture L2 unchanged.

No new fracture. No mass lesion. Mild disc degeneration in the
thoracic spine. Mild scoliosis.

Atherosclerotic calcification aortic arch. Chronic right rib
fractures.
IMPRESSION: Multiple chronic fractures.  No acute fracture

## 2021-04-01 DIAGNOSIS — R208 Other disturbances of skin sensation: Secondary | ICD-10-CM | POA: Diagnosis not present

## 2021-04-01 DIAGNOSIS — D485 Neoplasm of uncertain behavior of skin: Secondary | ICD-10-CM | POA: Diagnosis not present

## 2021-04-01 DIAGNOSIS — L919 Hypertrophic disorder of the skin, unspecified: Secondary | ICD-10-CM | POA: Diagnosis not present

## 2021-04-01 DIAGNOSIS — L538 Other specified erythematous conditions: Secondary | ICD-10-CM | POA: Diagnosis not present

## 2021-04-01 DIAGNOSIS — R234 Changes in skin texture: Secondary | ICD-10-CM | POA: Diagnosis not present

## 2021-04-01 DIAGNOSIS — C44219 Basal cell carcinoma of skin of left ear and external auricular canal: Secondary | ICD-10-CM | POA: Diagnosis not present

## 2021-04-17 DIAGNOSIS — J019 Acute sinusitis, unspecified: Secondary | ICD-10-CM | POA: Diagnosis not present

## 2021-06-09 DIAGNOSIS — C44219 Basal cell carcinoma of skin of left ear and external auricular canal: Secondary | ICD-10-CM | POA: Diagnosis not present

## 2021-06-30 DIAGNOSIS — T8140XA Infection following a procedure, unspecified, initial encounter: Secondary | ICD-10-CM | POA: Diagnosis not present

## 2021-10-27 DIAGNOSIS — E119 Type 2 diabetes mellitus without complications: Secondary | ICD-10-CM | POA: Diagnosis not present

## 2021-10-27 DIAGNOSIS — I10 Essential (primary) hypertension: Secondary | ICD-10-CM | POA: Diagnosis not present

## 2021-10-27 DIAGNOSIS — Z79899 Other long term (current) drug therapy: Secondary | ICD-10-CM | POA: Diagnosis not present

## 2021-10-27 DIAGNOSIS — R350 Frequency of micturition: Secondary | ICD-10-CM | POA: Diagnosis not present

## 2021-10-27 DIAGNOSIS — F419 Anxiety disorder, unspecified: Secondary | ICD-10-CM | POA: Diagnosis not present

## 2021-10-27 DIAGNOSIS — I700 Atherosclerosis of aorta: Secondary | ICD-10-CM | POA: Diagnosis not present

## 2021-10-27 DIAGNOSIS — M25561 Pain in right knee: Secondary | ICD-10-CM | POA: Diagnosis not present

## 2021-10-27 DIAGNOSIS — I2510 Atherosclerotic heart disease of native coronary artery without angina pectoris: Secondary | ICD-10-CM | POA: Diagnosis not present

## 2022-01-18 DIAGNOSIS — R351 Nocturia: Secondary | ICD-10-CM | POA: Diagnosis not present

## 2022-01-18 DIAGNOSIS — N401 Enlarged prostate with lower urinary tract symptoms: Secondary | ICD-10-CM | POA: Diagnosis not present

## 2022-01-29 DIAGNOSIS — E119 Type 2 diabetes mellitus without complications: Secondary | ICD-10-CM | POA: Diagnosis not present

## 2022-07-23 DIAGNOSIS — I700 Atherosclerosis of aorta: Secondary | ICD-10-CM | POA: Diagnosis not present

## 2022-07-23 DIAGNOSIS — I10 Essential (primary) hypertension: Secondary | ICD-10-CM | POA: Diagnosis not present

## 2022-07-23 DIAGNOSIS — Z79899 Other long term (current) drug therapy: Secondary | ICD-10-CM | POA: Diagnosis not present

## 2022-07-23 DIAGNOSIS — F419 Anxiety disorder, unspecified: Secondary | ICD-10-CM | POA: Diagnosis not present

## 2022-07-23 DIAGNOSIS — M25511 Pain in right shoulder: Secondary | ICD-10-CM | POA: Diagnosis not present

## 2022-07-23 DIAGNOSIS — Z23 Encounter for immunization: Secondary | ICD-10-CM | POA: Diagnosis not present

## 2022-07-23 DIAGNOSIS — I2510 Atherosclerotic heart disease of native coronary artery without angina pectoris: Secondary | ICD-10-CM | POA: Diagnosis not present

## 2022-07-23 DIAGNOSIS — E7800 Pure hypercholesterolemia, unspecified: Secondary | ICD-10-CM | POA: Diagnosis not present

## 2022-07-23 DIAGNOSIS — Z0000 Encounter for general adult medical examination without abnormal findings: Secondary | ICD-10-CM | POA: Diagnosis not present

## 2022-07-23 DIAGNOSIS — E119 Type 2 diabetes mellitus without complications: Secondary | ICD-10-CM | POA: Diagnosis not present

## 2022-10-27 DIAGNOSIS — E1165 Type 2 diabetes mellitus with hyperglycemia: Secondary | ICD-10-CM | POA: Diagnosis not present

## 2023-02-14 DIAGNOSIS — E7800 Pure hypercholesterolemia, unspecified: Secondary | ICD-10-CM | POA: Diagnosis not present

## 2023-02-14 DIAGNOSIS — F419 Anxiety disorder, unspecified: Secondary | ICD-10-CM | POA: Diagnosis not present

## 2023-02-14 DIAGNOSIS — E1165 Type 2 diabetes mellitus with hyperglycemia: Secondary | ICD-10-CM | POA: Diagnosis not present

## 2023-02-14 DIAGNOSIS — I700 Atherosclerosis of aorta: Secondary | ICD-10-CM | POA: Diagnosis not present

## 2023-02-14 DIAGNOSIS — Z79899 Other long term (current) drug therapy: Secondary | ICD-10-CM | POA: Diagnosis not present

## 2023-02-14 DIAGNOSIS — I2510 Atherosclerotic heart disease of native coronary artery without angina pectoris: Secondary | ICD-10-CM | POA: Diagnosis not present

## 2023-02-14 DIAGNOSIS — D649 Anemia, unspecified: Secondary | ICD-10-CM | POA: Diagnosis not present

## 2023-02-14 DIAGNOSIS — I10 Essential (primary) hypertension: Secondary | ICD-10-CM | POA: Diagnosis not present

## 2023-07-20 DIAGNOSIS — I2510 Atherosclerotic heart disease of native coronary artery without angina pectoris: Secondary | ICD-10-CM | POA: Diagnosis not present

## 2023-07-20 DIAGNOSIS — E1165 Type 2 diabetes mellitus with hyperglycemia: Secondary | ICD-10-CM | POA: Diagnosis not present

## 2023-07-20 DIAGNOSIS — I10 Essential (primary) hypertension: Secondary | ICD-10-CM | POA: Diagnosis not present

## 2023-07-20 DIAGNOSIS — E7800 Pure hypercholesterolemia, unspecified: Secondary | ICD-10-CM | POA: Diagnosis not present

## 2023-07-20 DIAGNOSIS — Z1331 Encounter for screening for depression: Secondary | ICD-10-CM | POA: Diagnosis not present

## 2023-07-20 DIAGNOSIS — I700 Atherosclerosis of aorta: Secondary | ICD-10-CM | POA: Diagnosis not present

## 2023-07-20 DIAGNOSIS — F419 Anxiety disorder, unspecified: Secondary | ICD-10-CM | POA: Diagnosis not present

## 2023-07-20 DIAGNOSIS — Z79899 Other long term (current) drug therapy: Secondary | ICD-10-CM | POA: Diagnosis not present

## 2023-07-20 DIAGNOSIS — Z23 Encounter for immunization: Secondary | ICD-10-CM | POA: Diagnosis not present

## 2023-07-20 DIAGNOSIS — Z0001 Encounter for general adult medical examination with abnormal findings: Secondary | ICD-10-CM | POA: Diagnosis not present

## 2024-01-13 DIAGNOSIS — E1165 Type 2 diabetes mellitus with hyperglycemia: Secondary | ICD-10-CM | POA: Diagnosis not present

## 2024-01-13 DIAGNOSIS — I2510 Atherosclerotic heart disease of native coronary artery without angina pectoris: Secondary | ICD-10-CM | POA: Diagnosis not present

## 2024-01-13 DIAGNOSIS — I10 Essential (primary) hypertension: Secondary | ICD-10-CM | POA: Diagnosis not present

## 2024-02-06 DIAGNOSIS — E7800 Pure hypercholesterolemia, unspecified: Secondary | ICD-10-CM | POA: Diagnosis not present

## 2024-02-06 DIAGNOSIS — I2510 Atherosclerotic heart disease of native coronary artery without angina pectoris: Secondary | ICD-10-CM | POA: Diagnosis not present

## 2024-02-06 DIAGNOSIS — Z125 Encounter for screening for malignant neoplasm of prostate: Secondary | ICD-10-CM | POA: Diagnosis not present

## 2024-02-06 DIAGNOSIS — E1165 Type 2 diabetes mellitus with hyperglycemia: Secondary | ICD-10-CM | POA: Diagnosis not present

## 2024-02-06 DIAGNOSIS — I10 Essential (primary) hypertension: Secondary | ICD-10-CM | POA: Diagnosis not present

## 2024-02-06 DIAGNOSIS — Z79899 Other long term (current) drug therapy: Secondary | ICD-10-CM | POA: Diagnosis not present

## 2024-02-06 DIAGNOSIS — F419 Anxiety disorder, unspecified: Secondary | ICD-10-CM | POA: Diagnosis not present

## 2024-02-11 DIAGNOSIS — I10 Essential (primary) hypertension: Secondary | ICD-10-CM | POA: Diagnosis not present

## 2024-02-11 DIAGNOSIS — I2510 Atherosclerotic heart disease of native coronary artery without angina pectoris: Secondary | ICD-10-CM | POA: Diagnosis not present

## 2024-02-11 DIAGNOSIS — E1165 Type 2 diabetes mellitus with hyperglycemia: Secondary | ICD-10-CM | POA: Diagnosis not present

## 2024-02-13 DIAGNOSIS — I2510 Atherosclerotic heart disease of native coronary artery without angina pectoris: Secondary | ICD-10-CM | POA: Diagnosis not present

## 2024-02-13 DIAGNOSIS — I10 Essential (primary) hypertension: Secondary | ICD-10-CM | POA: Diagnosis not present

## 2024-02-13 DIAGNOSIS — E7800 Pure hypercholesterolemia, unspecified: Secondary | ICD-10-CM | POA: Diagnosis not present

## 2024-02-13 DIAGNOSIS — E1165 Type 2 diabetes mellitus with hyperglycemia: Secondary | ICD-10-CM | POA: Diagnosis not present

## 2024-03-12 DIAGNOSIS — I2510 Atherosclerotic heart disease of native coronary artery without angina pectoris: Secondary | ICD-10-CM | POA: Diagnosis not present

## 2024-03-12 DIAGNOSIS — E1165 Type 2 diabetes mellitus with hyperglycemia: Secondary | ICD-10-CM | POA: Diagnosis not present

## 2024-03-12 DIAGNOSIS — I10 Essential (primary) hypertension: Secondary | ICD-10-CM | POA: Diagnosis not present

## 2024-03-15 DIAGNOSIS — E1165 Type 2 diabetes mellitus with hyperglycemia: Secondary | ICD-10-CM | POA: Diagnosis not present

## 2024-03-15 DIAGNOSIS — E7800 Pure hypercholesterolemia, unspecified: Secondary | ICD-10-CM | POA: Diagnosis not present

## 2024-03-15 DIAGNOSIS — I10 Essential (primary) hypertension: Secondary | ICD-10-CM | POA: Diagnosis not present

## 2024-03-15 DIAGNOSIS — I2510 Atherosclerotic heart disease of native coronary artery without angina pectoris: Secondary | ICD-10-CM | POA: Diagnosis not present

## 2024-04-11 DIAGNOSIS — H6121 Impacted cerumen, right ear: Secondary | ICD-10-CM | POA: Diagnosis not present

## 2024-04-11 DIAGNOSIS — J069 Acute upper respiratory infection, unspecified: Secondary | ICD-10-CM | POA: Diagnosis not present

## 2024-04-11 DIAGNOSIS — I10 Essential (primary) hypertension: Secondary | ICD-10-CM | POA: Diagnosis not present

## 2024-04-11 DIAGNOSIS — I2510 Atherosclerotic heart disease of native coronary artery without angina pectoris: Secondary | ICD-10-CM | POA: Diagnosis not present

## 2024-04-11 DIAGNOSIS — E1165 Type 2 diabetes mellitus with hyperglycemia: Secondary | ICD-10-CM | POA: Diagnosis not present

## 2024-04-15 DIAGNOSIS — E1165 Type 2 diabetes mellitus with hyperglycemia: Secondary | ICD-10-CM | POA: Diagnosis not present

## 2024-04-15 DIAGNOSIS — E7800 Pure hypercholesterolemia, unspecified: Secondary | ICD-10-CM | POA: Diagnosis not present

## 2024-04-15 DIAGNOSIS — I2510 Atherosclerotic heart disease of native coronary artery without angina pectoris: Secondary | ICD-10-CM | POA: Diagnosis not present

## 2024-04-15 DIAGNOSIS — I10 Essential (primary) hypertension: Secondary | ICD-10-CM | POA: Diagnosis not present

## 2024-05-15 DIAGNOSIS — I2510 Atherosclerotic heart disease of native coronary artery without angina pectoris: Secondary | ICD-10-CM | POA: Diagnosis not present

## 2024-05-15 DIAGNOSIS — I10 Essential (primary) hypertension: Secondary | ICD-10-CM | POA: Diagnosis not present

## 2024-05-15 DIAGNOSIS — E1165 Type 2 diabetes mellitus with hyperglycemia: Secondary | ICD-10-CM | POA: Diagnosis not present

## 2024-05-15 DIAGNOSIS — E7800 Pure hypercholesterolemia, unspecified: Secondary | ICD-10-CM | POA: Diagnosis not present

## 2024-05-22 DIAGNOSIS — M65341 Trigger finger, right ring finger: Secondary | ICD-10-CM | POA: Diagnosis not present

## 2024-05-22 DIAGNOSIS — M1711 Unilateral primary osteoarthritis, right knee: Secondary | ICD-10-CM | POA: Diagnosis not present

## 2024-06-15 DIAGNOSIS — E7800 Pure hypercholesterolemia, unspecified: Secondary | ICD-10-CM | POA: Diagnosis not present

## 2024-06-15 DIAGNOSIS — I10 Essential (primary) hypertension: Secondary | ICD-10-CM | POA: Diagnosis not present

## 2024-06-15 DIAGNOSIS — E1165 Type 2 diabetes mellitus with hyperglycemia: Secondary | ICD-10-CM | POA: Diagnosis not present

## 2024-06-15 DIAGNOSIS — I2510 Atherosclerotic heart disease of native coronary artery without angina pectoris: Secondary | ICD-10-CM | POA: Diagnosis not present

## 2024-08-20 NOTE — Progress Notes (Signed)
 Gerald Ray                                          MRN: [id number]   08/20/2024   The [facility] Quality Team Specialist reviewed this patient medical record for the purposes of chart review for care gap closure. The following were reviewed: chart review for care gap closure-kidney health evaluation for diabetes:eGFR  and uACR.    [facility] Quality Team
